# Patient Record
Sex: Female | Born: 1950
Health system: Southern US, Community
[De-identification: ages and names within clinical notes are randomized; demographics above are authoritative.]

## PROBLEM LIST (undated history)

## (undated) DIAGNOSIS — N390 Urinary tract infection, site not specified: Secondary | ICD-10-CM

## (undated) DIAGNOSIS — M549 Dorsalgia, unspecified: Secondary | ICD-10-CM

## (undated) HISTORY — PX: APPENDECTOMY: SHX54

## (undated) HISTORY — PX: CHOLECYSTECTOMY: SHX55

## (undated) HISTORY — PX: CARPAL TUNNEL RELEASE: SHX101

## (undated) HISTORY — PX: ABDOMINAL HYSTERECTOMY: SHX81

---

## 2005-12-08 ENCOUNTER — Emergency Department: Payer: Self-pay | Admitting: Emergency Medicine

## 2007-09-28 ENCOUNTER — Ambulatory Visit: Payer: Self-pay | Admitting: Obstetrics and Gynecology

## 2008-01-21 ENCOUNTER — Ambulatory Visit: Payer: Self-pay | Admitting: Family Medicine

## 2013-01-16 ENCOUNTER — Ambulatory Visit: Payer: Self-pay | Admitting: Family Medicine

## 2013-01-16 ENCOUNTER — Emergency Department: Payer: Self-pay | Admitting: Emergency Medicine

## 2013-01-16 LAB — CBC WITH DIFFERENTIAL/PLATELET
Basophil #: 0.1 10*3/uL (ref 0.0–0.1)
Basophil %: 0.4 %
Eosinophil #: 0 10*3/uL (ref 0.0–0.7)
Eosinophil %: 0.1 %
HCT: 41.4 % (ref 35.0–47.0)
HGB: 14.1 g/dL (ref 12.0–16.0)
Lymphocyte #: 2.3 10*3/uL (ref 1.0–3.6)
Lymphocyte %: 14.4 %
MCH: 30.2 pg (ref 26.0–34.0)
MCHC: 34.1 g/dL (ref 32.0–36.0)
MCV: 89 fL (ref 80–100)
Monocyte #: 0.9 x10 3/mm (ref 0.2–0.9)
Monocyte %: 5.8 %
Neutrophil #: 12.6 10*3/uL — ABNORMAL HIGH (ref 1.4–6.5)
Neutrophil %: 79.3 %
Platelet: 298 10*3/uL (ref 150–440)
RBC: 4.68 10*6/uL (ref 3.80–5.20)
RDW: 12.4 % (ref 11.5–14.5)
WBC: 15.9 10*3/uL — ABNORMAL HIGH (ref 3.6–11.0)

## 2013-01-16 LAB — COMPREHENSIVE METABOLIC PANEL
Albumin: 4.3 g/dL (ref 3.4–5.0)
Alkaline Phosphatase: 109 U/L (ref 50–136)
Anion Gap: 12 (ref 7–16)
BUN: 11 mg/dL (ref 7–18)
Bilirubin,Total: 0.9 mg/dL (ref 0.2–1.0)
Calcium, Total: 8.8 mg/dL (ref 8.5–10.1)
Chloride: 103 mmol/L (ref 98–107)
Co2: 25 mmol/L (ref 21–32)
Creatinine: 0.85 mg/dL (ref 0.60–1.30)
EGFR (African American): >60
EGFR (Non-African Amer.): >60
Glucose: 108 mg/dL — ABNORMAL HIGH (ref 65–99)
Osmolality: 279 (ref 275–301)
Potassium: 3.7 mmol/L (ref 3.5–5.1)
SGOT(AST): 16 U/L (ref 15–37)
SGPT (ALT): 26 U/L (ref 12–78)
Sodium: 140 mmol/L (ref 136–145)
Total Protein: 8.2 g/dL (ref 6.4–8.2)

## 2013-01-16 LAB — URINALYSIS, COMPLETE
Bilirubin,UR: NEGATIVE
Glucose,UR: NEGATIVE mg/dL (ref 0–75)
Ketone: NEGATIVE
Leukocyte Esterase: NEGATIVE
Nitrite: POSITIVE
Ph: 6.5 (ref 4.5–8.0)
Protein: NEGATIVE
Specific Gravity: 1.015 (ref 1.003–1.030)
Squamous Epithelial: NONE SEEN

## 2013-01-16 LAB — WET PREP, GENITAL

## 2013-01-16 LAB — OCCULT BLOOD X 1 CARD TO LAB, STOOL: Occult Blood, Feces: NEGATIVE

## 2013-01-17 LAB — GC/CHLAMYDIA PROBE AMP

## 2013-01-19 LAB — URINE CULTURE

## 2013-03-16 ENCOUNTER — Ambulatory Visit: Payer: Self-pay | Admitting: Family Medicine

## 2013-03-16 LAB — URINALYSIS, COMPLETE
Glucose,UR: NEGATIVE mg/dL (ref 0–75)
Ketone: NEGATIVE
Protein: NEGATIVE
Specific Gravity: 1.02 (ref 1.003–1.030)
Squamous Epithelial: NONE SEEN

## 2013-03-30 ENCOUNTER — Ambulatory Visit: Payer: Self-pay | Admitting: Internal Medicine

## 2013-05-11 ENCOUNTER — Ambulatory Visit: Payer: Self-pay | Admitting: Internal Medicine

## 2014-07-29 ENCOUNTER — Ambulatory Visit: Payer: Self-pay

## 2015-09-14 IMAGING — MG MM ADDITIONAL VIEWS AT NO CHARGE
1 series · 2 of 2 positions shown · non-contrast
Comparison: 03/30/2013

CLINICAL DATA: Possible mass left breast identified on the CC view
of the recent screening mammogram.

EXAM:
DIGITAL DIAGNOSTIC  LEFT MAMMOGRAM

[L ML · left · 2 of 2 slices shown]
[im 1/2]
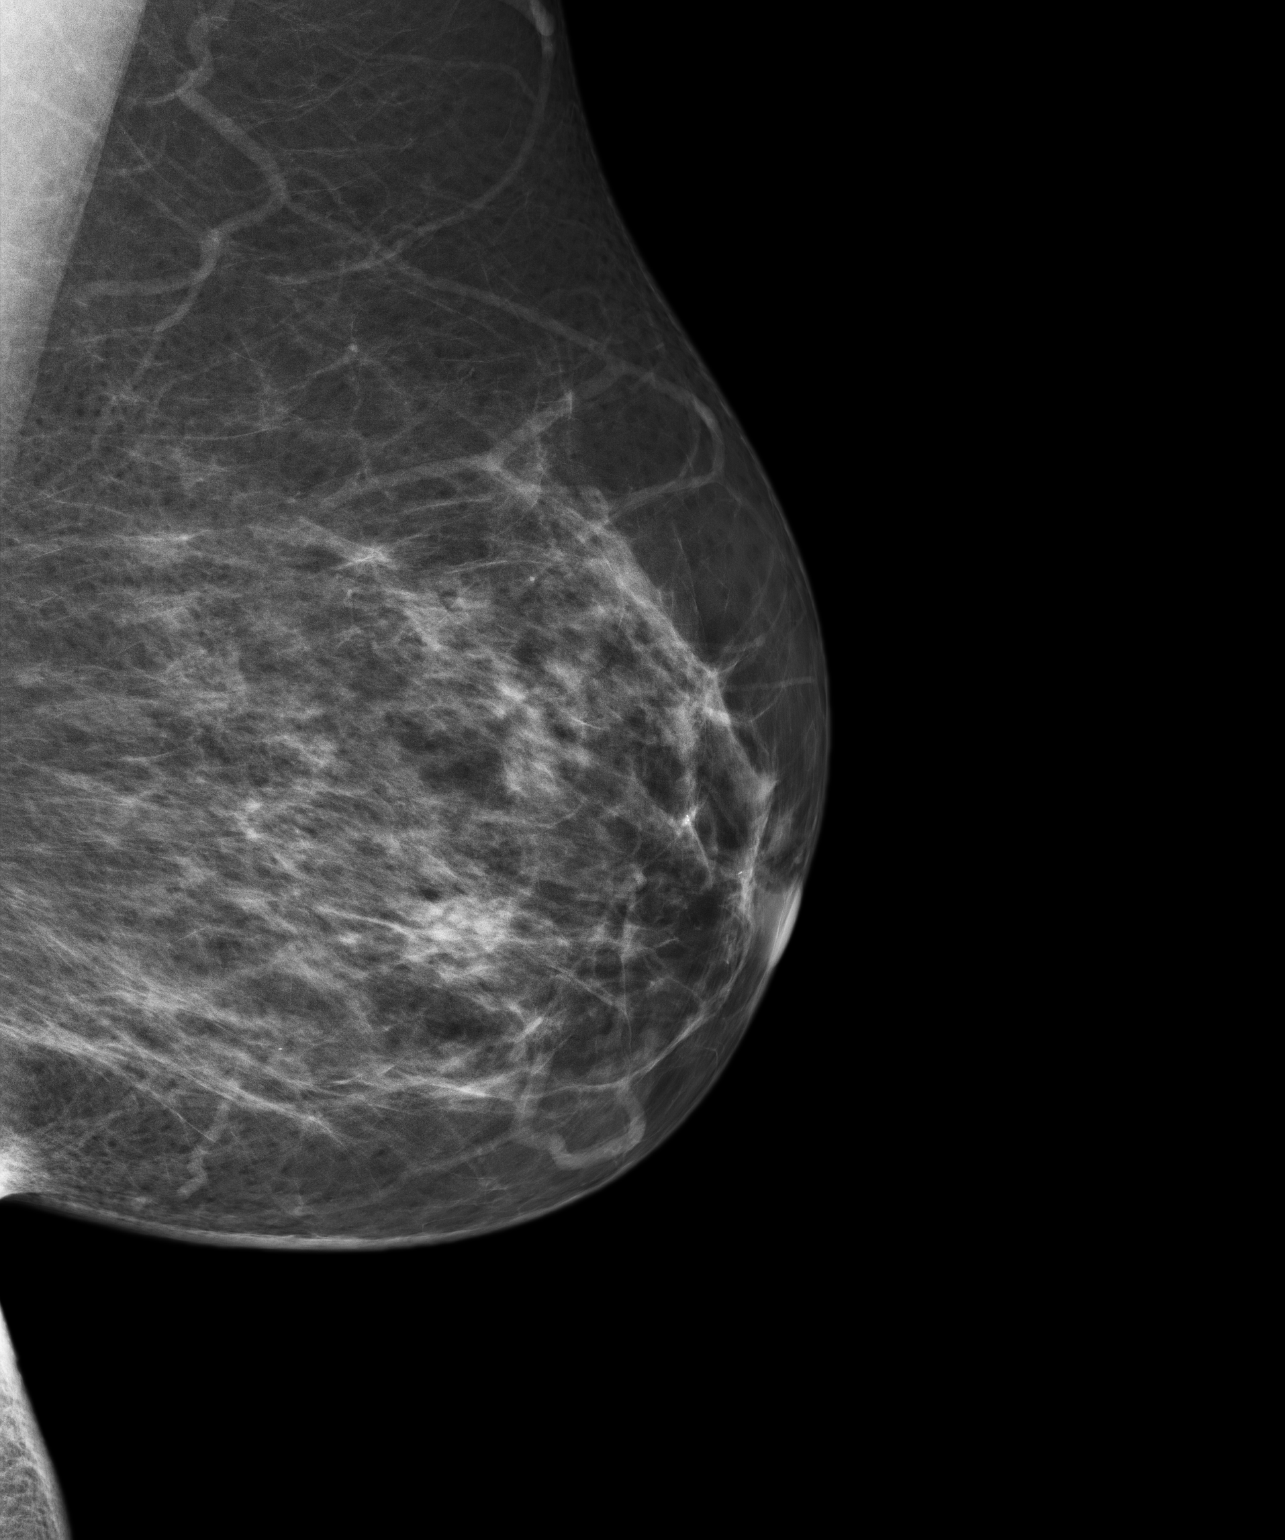
[im 2/2]
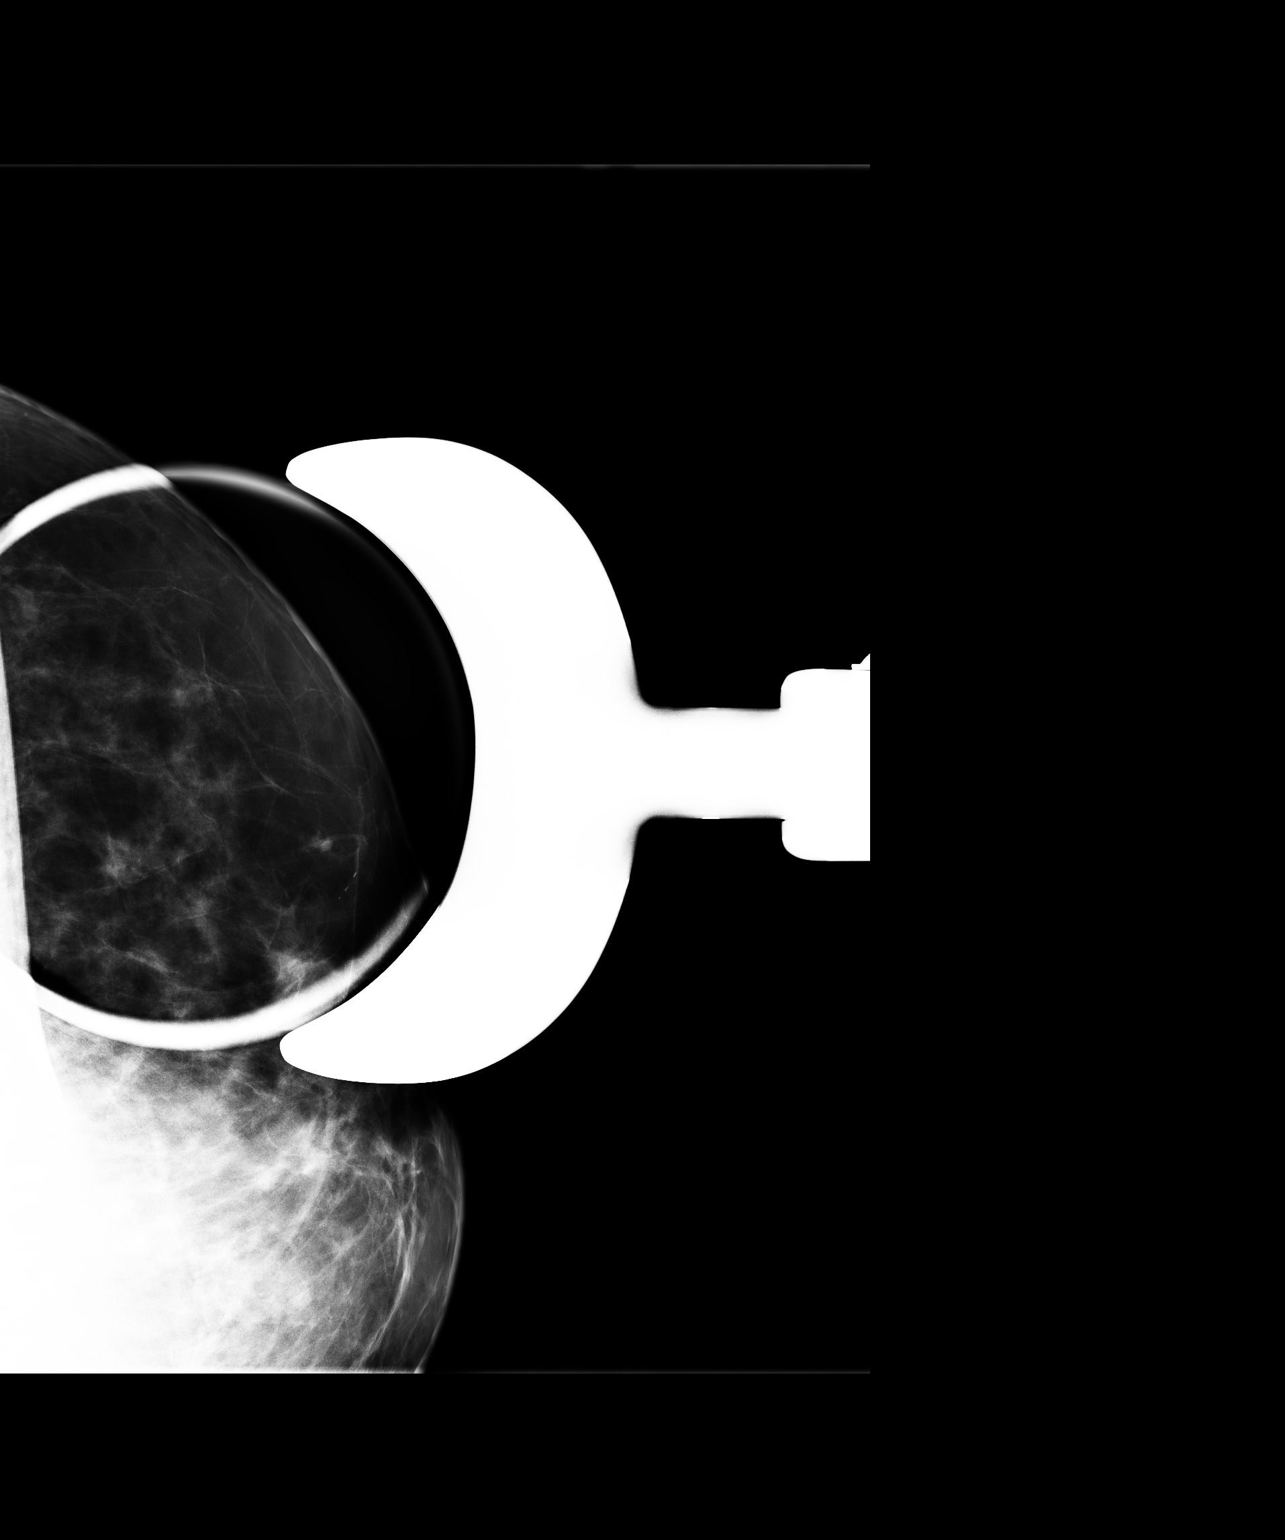

[2 of 2 positions shown; findings below may reference images not displayed]

ACR Breast Density Category b: There are scattered areas of
fibroglandular density.
FINDINGS: Focal spot compression view of the outer left breast in the CC
projection shows dispersion of fibroglandular breast tissue. No
evidence of mass. 90 degree lateral view of the left breast is
negative.
IMPRESSION: No evidence of malignancy in the left breast.

RECOMMENDATION:
Bilateral screening mammogram in 1 year.

I have discussed the findings and recommendations with the patient.
Results were also provided in writing at the conclusion of the
visit. If applicable, a reminder letter will be sent to the patient
regarding the next appointment.

BI-RADS CATEGORY  1: Negative

## 2015-09-22 ENCOUNTER — Ambulatory Visit
Admission: EM | Admit: 2015-09-22 | Discharge: 2015-09-22 | Disposition: A | Discharge status: Home / Self Care | Payer: BLUE CROSS/BLUE SHIELD | Attending: Family Medicine | Admitting: Family Medicine

## 2015-09-22 DIAGNOSIS — H6502 Acute serous otitis media, left ear: Secondary | ICD-10-CM

## 2015-09-22 LAB — RAPID INFLUENZA A&B ANTIGENS (ARMC ONLY)
INFLUENZA A (ARMC): NEGATIVE
INFLUENZA B (ARMC): NEGATIVE

## 2015-09-22 MED ORDER — AMOXICILLIN 875 MG PO TABS: 875.0000 mg | ORAL_TABLET | Freq: Two times a day (BID) | ORAL | Status: DC

## 2015-09-22 NOTE — ED Notes (Signed)
Sudden onset of bodyaches, runny nose, sneezing and watering eyes. Denies fever. + tight cough

## 2015-09-22 NOTE — ED Provider Notes (Signed)
CSN: [id number]     Arrival date & time 09/22/15  1316 History   First MD Initiated Contact with Patient 09/22/15 1457     Chief Complaint  Patient presents with  . Influenza   (Consider location/radiation/quality/duration/timing/severity/associated sxs/prior Treatment) Patient is a 65 y.o. female presenting with URI. The history is provided by the patient.  URI Presenting symptoms: congestion, cough, ear pain and rhinorrhea   Severity:  Moderate Onset quality:  Sudden Duration:  3 days Timing:  Constant Progression:  Worsening Chronicity:  New Relieved by:  Nothing Ineffective treatments:  OTC medications Associated symptoms: myalgias and sneezing   Associated symptoms: no sinus pain and no wheezing   Risk factors: sick contacts   Risk factors: not elderly, no chronic cardiac disease, no chronic kidney disease, no chronic respiratory disease, no diabetes mellitus, no immunosuppression, no recent illness and no recent travel     History reviewed. No pertinent past medical history. Past Surgical History  Procedure Laterality Date  . Cholecystectomy    . Appendectomy    . Abdominal hysterectomy     Family History  Problem Relation Age of Onset  . Cancer Mother    Social History  Substance Use Topics  . Smoking status: Never Smoker   . Smokeless tobacco: None  . Alcohol Use: No   OB History    No data available     Review of Systems  HENT: Positive for congestion, ear pain, rhinorrhea and sneezing.   Respiratory: Positive for cough. Negative for wheezing.   Musculoskeletal: Positive for myalgias.    Allergies  Review of patient's allergies indicates no known allergies.  Home Medications   Prior to Admission medications   Medication Sig Start Date End Date Taking? Authorizing Provider  cetirizine (ZYRTEC) 10 MG tablet Take 10 mg by mouth daily.   Yes Historical Provider, MD  amoxicillin (AMOXIL) 875 MG tablet Take 1 tablet (875 mg total) by mouth 2 (two) times  daily. 09/22/15   Norval Gable, MD   Meds Ordered and Administered this Visit  Medications - No data to display  BP 123/65 mmHg  Pulse 82  Temp(Src) 98.2 F (36.8 C) (Tympanic)  Resp 18  Ht 5\' 3"  (1.6 m)  Wt 230 lb (104.327 kg)  BMI 40.75 kg/m2  SpO2 98% No data found.   Physical Exam  Constitutional: She appears well-developed and well-nourished. No distress.  HENT:  Head: Normocephalic and atraumatic.  Right Ear: Tympanic membrane, external ear and ear canal normal.  Left Ear: External ear and ear canal normal. Tympanic membrane is erythematous and bulging. A middle ear effusion is present.  Nose: Mucosal edema and rhinorrhea present. No nose lacerations, sinus tenderness, nasal deformity, septal deviation or nasal septal hematoma. No epistaxis.  No foreign bodies. Right sinus exhibits maxillary sinus tenderness and frontal sinus tenderness. Left sinus exhibits maxillary sinus tenderness and frontal sinus tenderness.  Mouth/Throat: Uvula is midline, oropharynx is clear and moist and mucous membranes are normal. No oropharyngeal exudate.  Eyes: Conjunctivae and EOM are normal. Pupils are equal, round, and reactive to light. Right eye exhibits no discharge. Left eye exhibits no discharge. No scleral icterus.  Neck: Normal range of motion. Neck supple. No thyromegaly present.  Cardiovascular: Normal rate, regular rhythm and normal heart sounds.   Pulmonary/Chest: Effort normal and breath sounds normal. No respiratory distress. She has no wheezes. She has no rales.  Lymphadenopathy:    She has no cervical adenopathy.  Skin: She is not diaphoretic.  Nursing  note and vitals reviewed.   ED Course  Procedures (including critical care time)  Labs Review Labs Reviewed  RAPID INFLUENZA A&B ANTIGENS ([place])    Imaging Review No results found.   Visual Acuity Review  Right Eye Distance:   Left Eye Distance:   Bilateral Distance:    Right Eye Near:   Left Eye Near:     Bilateral Near:         MDM   1. Acute serous otitis media of left ear, recurrence not specified    Discharge Medication List as of 09/22/2015  3:25 PM    START taking these medications   Details  amoxicillin (AMOXIL) 875 MG tablet Take 1 tablet (875 mg total) by mouth 2 (two) times daily., Starting 09/22/2015, Until Discontinued, Print       1. Lab results and diagnosis reviewed with patient 2. rx as per orders above; reviewed possible side effects, interactions, risks and benefits  3. Recommend supportive treatment with otc analgesics prn 4. Follow-up prn if symptoms worsen or don't improve   Norval Gable, MD 09/22/15 1646

## 2015-10-03 ENCOUNTER — Encounter: Payer: Self-pay | Admitting: Internal Medicine

## 2015-10-03 DIAGNOSIS — K5730 Diverticulosis of large intestine without perforation or abscess without bleeding: Secondary | ICD-10-CM | POA: Insufficient documentation

## 2015-10-03 DIAGNOSIS — K760 Fatty (change of) liver, not elsewhere classified: Secondary | ICD-10-CM | POA: Insufficient documentation

## 2015-10-03 DIAGNOSIS — E785 Hyperlipidemia, unspecified: Secondary | ICD-10-CM | POA: Insufficient documentation

## 2015-10-06 ENCOUNTER — Ambulatory Visit (INDEPENDENT_AMBULATORY_CARE_PROVIDER_SITE_OTHER): Payer: BLUE CROSS/BLUE SHIELD | Admitting: Internal Medicine

## 2015-10-06 ENCOUNTER — Encounter: Payer: Self-pay | Admitting: Internal Medicine

## 2015-10-06 VITALS — BP 130/82 | HR 76 | Ht 63.0 in | Wt 242.0 lb

## 2015-10-06 DIAGNOSIS — M65311 Trigger thumb, right thumb: Secondary | ICD-10-CM | POA: Diagnosis not present

## 2015-10-06 DIAGNOSIS — Z1211 Encounter for screening for malignant neoplasm of colon: Secondary | ICD-10-CM | POA: Diagnosis not present

## 2015-10-06 DIAGNOSIS — G8929 Other chronic pain: Secondary | ICD-10-CM

## 2015-10-06 DIAGNOSIS — M62830 Muscle spasm of back: Secondary | ICD-10-CM | POA: Diagnosis not present

## 2015-10-06 DIAGNOSIS — R110 Nausea: Secondary | ICD-10-CM | POA: Diagnosis not present

## 2015-10-06 DIAGNOSIS — M533 Sacrococcygeal disorders, not elsewhere classified: Secondary | ICD-10-CM

## 2015-10-06 MED ORDER — ETODOLAC 300 MG PO CAPS: 300.0000 mg | ORAL_CAPSULE | Freq: Three times a day (TID) | ORAL | Status: DC

## 2015-10-06 MED ORDER — METHOCARBAMOL 500 MG PO TABS: 500.0000 mg | ORAL_TABLET | Freq: Three times a day (TID) | ORAL | Status: DC

## 2015-10-06 NOTE — Progress Notes (Signed)
Date:  10/06/2015   Name:  Stephanie Ferguson   DOB:  1951-06-24   MRN:  [id number]   Chief Complaint: Nausea; Back Pain; and Thumb Pain Back Pain This is a chronic problem. The current episode started more than 1 year ago. The problem occurs daily. The problem is unchanged. The pain is present in the lumbar spine. The symptoms are aggravated by standing and twisting. Pertinent negatives include no abdominal pain, chest pain, dysuria, fever, headaches, numbness or weakness. She has tried heat (but advil does not help) for the symptoms.   Nausea - constant nausea - no vomiting. No change in bowels.  She has waves of nausea that come and go.  She has not tried any otc medication.  She does have early satiety but has gained weight. Nausea has been ongoing issue for many years. The only workup with a CT of the abdomen which showed diverticulitis. She's never had a colonoscopy or an EGD.   Thumb pain - unable to bend right thumb.  No injury.  Started with a clicking and now can not bend at all for the past few weeks. 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 She is right-handed.  Review of Systems  Constitutional: Negative for fever, chills, fatigue and unexpected weight change.  HENT: Negative for sore throat, tinnitus and trouble swallowing.   Respiratory: Negative for cough, chest tightness and shortness of breath.   Cardiovascular: Negative for chest pain, palpitations and leg swelling.  Gastrointestinal: Positive for nausea. Negative for vomiting, abdominal pain, diarrhea, constipation and blood in stool.  Genitourinary: Negative for dysuria.  Musculoskeletal: Positive for back pain and arthralgias. Negative for myalgias and gait problem.  Neurological: Negative for tremors, syncope, weakness, numbness and headaches.  Psychiatric/Behavioral: Negative for sleep disturbance.    Patient Active Problem List   Diagnosis Date Noted  .  Sigmoid diverticulosis 10/03/2015  . Fatty liver 10/03/2015  . Hyperlipidemia, mild 10/03/2015    Prior to Admission medications   Medication Sig Start Date End Date Taking? Authorizing Provider  cetirizine (ZYRTEC) 10 MG tablet Take 10 mg by mouth daily.   Yes Historical Provider, MD    No Known Allergies  Past Surgical History  Procedure Laterality Date  . Cholecystectomy    . Appendectomy    . Abdominal hysterectomy      ovaries remain    Social History  Substance Use Topics  . Smoking status: Never Smoker   . Smokeless tobacco: None  . Alcohol Use: No    Medication list has been reviewed and updated.   Physical Exam  Constitutional: She is oriented to person, place, and time. She appears well-developed. No distress.  HENT:  Head: Normocephalic and atraumatic.  Neck: Normal range of motion. Neck supple. Carotid bruit is not present. No thyromegaly present.  Cardiovascular: Normal rate, regular rhythm and normal heart sounds.   Pulmonary/Chest: Effort normal and breath sounds normal. No respiratory distress. She has no wheezes. She has no rales.  Abdominal: Soft. Normal appearance and bowel sounds are normal. There is no tenderness. There is no CVA tenderness.  Musculoskeletal: Normal range of motion.       Right hip: She exhibits tenderness (with internal and external rotation).       Lumbar back: She exhibits tenderness and spasm. She exhibits normal range of motion.  SLR negative bilaterally  Right thumb tender at base of MCP, good movement at MCP  and PIP but unable to bend DIP independently.  DIP moves freely with passive motion  Lymphadenopathy:    She has no cervical adenopathy.  Neurological: She is alert and oriented to person, place, and time.  Skin: Skin is warm and dry. No rash noted.  Psychiatric: She has a normal mood and affect. Her speech is normal and behavior is normal. Thought content normal.  Nursing note and vitals reviewed.   BP 130/82 mmHg   Pulse 76  Ht 5\' 3"  (1.6 m)  Wt 242 lb (109.77 kg)  BMI 42.88 kg/m2  Assessment and Plan: 1. Trigger thumb of right hand - Ambulatory referral to Orthopedic Surgery  2. Muscle spasm of back Continue to use heat; add robaxin - TSH - methocarbamol (ROBAXIN) 500 MG tablet; Take 1 tablet (500 mg total) by mouth 3 (three) times daily.  Dispense: 90 tablet; Refill: 0  3. Chronic SI joint pain Continue heat; add robaxin and lodine - etodolac (LODINE) 300 MG capsule; Take 1 capsule (300 mg total) by mouth every 8 (eight) hours.  Dispense: 90 capsule; Refill: 3  4. Nausea Uncertain cause - rule out H Pylori and refer to GI for possible EGD - Comprehensive metabolic panel - CBC with Differential/Platelet - H. pylori antibody, IgG - Ambulatory referral to Gastroenterology  5. Colon cancer screening Referred to GI   Halina Maidens, MD [facility]  10/06/2015

## 2015-10-06 NOTE — Patient Instructions (Signed)
Sacroiliac (SI) Joint Injection Patient Information  Description: The sacroiliac joint connects the scrum (very low back and tailbone) to the ilium (a pelvic bone which also forms half of the hip joint).  Normally this joint experiences very little motion.  When this joint becomes inflamed or unstable low back and or hip and pelvis pain may result.  Injection of this joint with local anesthetics (numbing medicines) and steroids can provide diagnostic information and reduce pain.  This injection is performed with the aid of x-ray guidance into the tailbone area while you are lying on your stomach.   You may experience an electrical sensation down the leg while this is being done.  You may also experience numbness.  We also may ask if we are reproducing your normal pain during the injection.  Conditions which may be treated SI injection:   Low back, buttock, hip or leg pain  Preparation for the Injection:  1. Do not eat any solid food or dairy products within 8 hours of your appointment.  2. You may drink clear liquids up to 3 hours before appointment.  Clear liquids include water, black coffee, juice or soda.  No milk or cream please. 3. You may take your regular medications, including pain medications with a sip of water before your appointment.  Diabetics should hold regular insulin (if take separately) and take 1/2 normal NPH dose the morning of the procedure.  Carry some sugar containing items with you to your appointment. 4. A driver must accompany you and be prepared to drive you home after your procedure. 5. Bring all of your current medications with you. 6. An IV may be inserted and sedation may be given at the discretion of the physician. 7. A blood pressure cuff, EKG and other monitors will often be applied during the procedure.  Some patients may need to have extra oxygen administered for a short period.  8. You will be asked to provide medical information, including your allergies,  prior to the procedure.  We must know immediately if you are taking blood thinners (like Coumadin/Warfarin) or if you are allergic to IV iodine contrast (dye).  We must know if you could possible be pregnant.  Possible side effects:   Bleeding from needle site  Infection (rare, may require surgery)  Nerve injury (rare)  Numbness & tingling (temporary)  A brief convulsion or seizure  Light-headedness (temporary)  Pain at injection site (several days)  Decreased blood pressure (temporary)  Weakness in the leg (temporary)   Call if you experience:   New onset weakness or numbness of an extremity below the injection site that last more than 8 hours.  Hives or difficulty breathing ( go to the emergency room)  Inflammation or drainage at the injection site  Any new symptoms which are concerning to you  Please note:  Although the local anesthetic injected can often make your back/ hip/ buttock/ leg feel good for several hours after the injections, the pain will likely return.  It takes 3-7 days for steroids to work in the sacroiliac area.  You may not notice any pain relief for at least that one week.  If effective, we will often do a series of three injections spaced 3-6 weeks apart to maximally decrease your pain.  After the initial series, we generally will wait some months before a repeat injection of the same type.  If you have any questions, please call [phone] [facility]   

## 2015-10-07 ENCOUNTER — Other Ambulatory Visit: Payer: Self-pay | Admitting: Internal Medicine

## 2015-10-07 LAB — COMPREHENSIVE METABOLIC PANEL
ALT: 24 IU/L (ref 0–32)
AST: 23 IU/L (ref 0–40)
Albumin/Globulin Ratio: 1.7 (ref 1.2–2.2)
Albumin: 4.4 g/dL (ref 3.6–4.8)
Alkaline Phosphatase: 98 IU/L (ref 39–117)
BUN/Creatinine Ratio: 15 (ref 11–26)
BUN: 11 mg/dL (ref 8–27)
Bilirubin Total: 0.6 mg/dL (ref 0.0–1.2)
CALCIUM: 8.7 mg/dL (ref 8.7–10.3)
CO2: 23 mmol/L (ref 18–29)
CREATININE: 0.71 mg/dL (ref 0.57–1.00)
Chloride: 99 mmol/L (ref 96–106)
GFR calc Af Amer: 106 mL/min/{1.73_m2} (ref >59)
GFR, EST NON AFRICAN AMERICAN: 92 mL/min/{1.73_m2} (ref >59)
GLUCOSE: 94 mg/dL (ref 65–99)
Globulin, Total: 2.6 g/dL (ref 1.5–4.5)
POTASSIUM: 4.5 mmol/L (ref 3.5–5.2)
Sodium: 140 mmol/L (ref 134–144)
Total Protein: 7 g/dL (ref 6.0–8.5)

## 2015-10-07 LAB — CBC WITH DIFFERENTIAL/PLATELET
BASOS: 1 %
Basophils Absolute: 0.1 10*3/uL (ref 0.0–0.2)
EOS (ABSOLUTE): 0.1 10*3/uL (ref 0.0–0.4)
EOS: 2 %
Hematocrit: 42.5 % (ref 34.0–46.6)
Hemoglobin: 14.1 g/dL (ref 11.1–15.9)
IMMATURE GRANS (ABS): 0 10*3/uL (ref 0.0–0.1)
IMMATURE GRANULOCYTES: 0 %
LYMPHS: 39 %
Lymphocytes Absolute: 3.7 10*3/uL — ABNORMAL HIGH (ref 0.7–3.1)
MCH: 29.6 pg (ref 26.6–33.0)
MCHC: 33.2 g/dL (ref 31.5–35.7)
MCV: 89 fL (ref 79–97)
Monocytes Absolute: 0.7 10*3/uL (ref 0.1–0.9)
Monocytes: 7 %
NEUTROS PCT: 51 %
Neutrophils Absolute: 4.8 10*3/uL (ref 1.4–7.0)
PLATELETS: 323 10*3/uL (ref 150–379)
RBC: 4.77 x10E6/uL (ref 3.77–5.28)
RDW: 13.5 % (ref 12.3–15.4)
WBC: 9.3 10*3/uL (ref 3.4–10.8)

## 2015-10-07 LAB — TSH: TSH: 1.44 u[IU]/mL (ref 0.450–4.500)

## 2015-10-07 LAB — H. PYLORI ANTIBODY, IGG: H PYLORI IGG: 7.8 U/mL — AB (ref 0.0–0.8)

## 2015-10-07 MED ORDER — METRONIDAZOLE 500 MG PO TABS: 500.0000 mg | ORAL_TABLET | Freq: Three times a day (TID) | ORAL | Status: DC

## 2015-10-07 MED ORDER — OMEPRAZOLE 20 MG PO CPDR: 20.0000 mg | DELAYED_RELEASE_CAPSULE | Freq: Every day | ORAL | Status: DC

## 2015-10-07 MED ORDER — CIPROFLOXACIN HCL 500 MG PO TABS: 500.0000 mg | ORAL_TABLET | Freq: Two times a day (BID) | ORAL | Status: DC

## 2015-10-08 ENCOUNTER — Telehealth: Payer: Self-pay

## 2015-10-08 NOTE — Telephone Encounter (Signed)
-----   Message from Glean Hess, MD sent at 10/07/2015  5:09 PM EDT ----- Labs are normal except for H Pylori [likely causing nausea].  Need to treat with double antibiotics plus omeprazole, all for 7 days.  I will send to pharmacy.

## 2015-10-08 NOTE — Telephone Encounter (Signed)
Spoke with patient. Patient advised of all results and verbalized understanding. Will call back with any future questions or concerns. MA[facility]  

## 2015-10-17 ENCOUNTER — Telehealth: Payer: Self-pay

## 2015-10-17 ENCOUNTER — Other Ambulatory Visit: Payer: Self-pay

## 2015-10-17 NOTE — Telephone Encounter (Signed)
Gastroenterology Pre-Procedure Review  Request Date: 11/06/15 Requesting Physician: Dr. Army Melia  PATIENT REVIEW QUESTIONS: The patient responded to the following health history questions as indicated:    1. Are you having any GI issues? no 2. Do you have a personal history of Polyps? no 3. Do you have a family history of Colon Cancer or Polyps? no 4. Diabetes Mellitus? no 5. Joint replacements in the past 12 months?no 6. Major health problems in the past 3 months?no 7. Any artificial heart valves, MVP, or defibrillator?no    MEDICATIONS & ALLERGIES:    Patient reports the following regarding taking any anticoagulation/antiplatelet therapy:   Plavix, Coumadin, Eliquis, Xarelto, Lovenox, Pradaxa, Brilinta, or Effient? no Aspirin? no  Patient confirms/reports the following medications:  Current Outpatient Prescriptions  Medication Sig Dispense Refill  . cetirizine (ZYRTEC) 10 MG tablet Take 10 mg by mouth daily.    . ciprofloxacin (CIPRO) 500 MG tablet Take 1 tablet (500 mg total) by mouth 2 (two) times daily. 14 tablet 0  . etodolac (LODINE) 300 MG capsule Take 1 capsule (300 mg total) by mouth every 8 (eight) hours. 90 capsule 3  . methocarbamol (ROBAXIN) 500 MG tablet Take 1 tablet (500 mg total) by mouth 3 (three) times daily. 90 tablet 0  . metroNIDAZOLE (FLAGYL) 500 MG tablet Take 1 tablet (500 mg total) by mouth 3 (three) times daily. 21 tablet 0  . omeprazole (PRILOSEC) 20 MG capsule Take 1 capsule (20 mg total) by mouth daily. 60 capsule 5   No current facility-administered medications for this visit.    Patient confirms/reports the following allergies:  No Known Allergies  No orders of the defined types were placed in this encounter.    AUTHORIZATION INFORMATION Primary Insurance: 1D#: Group #:  Secondary Insurance: 1D#: Group #:  SCHEDULE INFORMATION: Date: 11/06/15 Time: Location: [place]

## 2015-11-04 ENCOUNTER — Other Ambulatory Visit: Payer: Self-pay

## 2015-11-04 MED ORDER — PEG 3350-KCL-NABCB-NACL-NASULF 236 G PO SOLR: 4000.0000 mL | Freq: Once | ORAL | Status: DC

## 2015-11-05 NOTE — Discharge Instructions (Signed)

## 2015-11-06 ENCOUNTER — Ambulatory Visit: Payer: BLUE CROSS/BLUE SHIELD | Admitting: Anesthesiology

## 2015-11-06 ENCOUNTER — Ambulatory Visit
Admission: RE | Admit: 2015-11-06 | Discharge: 2015-11-06 | Disposition: A | Discharge status: Home / Self Care | Payer: BLUE CROSS/BLUE SHIELD | Source: Ambulatory Visit | Attending: Gastroenterology | Admitting: Gastroenterology

## 2015-11-06 ENCOUNTER — Encounter
Admission: RE | Disposition: A | Discharge status: Home / Self Care | Payer: Self-pay | Source: Ambulatory Visit | Attending: Gastroenterology

## 2015-11-06 ENCOUNTER — Ambulatory Visit: Admit: 2015-11-06 | Payer: Self-pay | Admitting: Gastroenterology

## 2015-11-06 DIAGNOSIS — D126 Benign neoplasm of colon, unspecified: Secondary | ICD-10-CM | POA: Insufficient documentation

## 2015-11-06 DIAGNOSIS — K2970 Gastritis, unspecified, without bleeding: Secondary | ICD-10-CM | POA: Diagnosis not present

## 2015-11-06 DIAGNOSIS — R110 Nausea: Secondary | ICD-10-CM | POA: Insufficient documentation

## 2015-11-06 DIAGNOSIS — B9681 Helicobacter pylori [H. pylori] as the cause of diseases classified elsewhere: Secondary | ICD-10-CM | POA: Insufficient documentation

## 2015-11-06 DIAGNOSIS — K2950 Unspecified chronic gastritis without bleeding: Secondary | ICD-10-CM | POA: Insufficient documentation

## 2015-11-06 DIAGNOSIS — K641 Second degree hemorrhoids: Secondary | ICD-10-CM | POA: Insufficient documentation

## 2015-11-06 DIAGNOSIS — K921 Melena: Secondary | ICD-10-CM | POA: Diagnosis not present

## 2015-11-06 DIAGNOSIS — K5730 Diverticulosis of large intestine without perforation or abscess without bleeding: Secondary | ICD-10-CM | POA: Insufficient documentation

## 2015-11-06 DIAGNOSIS — Z79899 Other long term (current) drug therapy: Secondary | ICD-10-CM | POA: Diagnosis not present

## 2015-11-06 DIAGNOSIS — R6881 Early satiety: Secondary | ICD-10-CM | POA: Diagnosis not present

## 2015-11-06 DIAGNOSIS — D124 Benign neoplasm of descending colon: Secondary | ICD-10-CM | POA: Diagnosis not present

## 2015-11-06 HISTORY — PX: ESOPHAGOGASTRODUODENOSCOPY (EGD) WITH PROPOFOL: SHX5813

## 2015-11-06 HISTORY — DX: Dorsalgia, unspecified: M54.9

## 2015-11-06 HISTORY — PX: POLYPECTOMY: SHX5525

## 2015-11-06 HISTORY — DX: Urinary tract infection, site not specified: N39.0

## 2015-11-06 HISTORY — PX: COLONOSCOPY WITH PROPOFOL: SHX5780

## 2015-11-06 SURGERY — COLONOSCOPY WITH PROPOFOL
Anesthesia: Monitor Anesthesia Care

## 2015-11-06 SURGERY — COLONOSCOPY WITH PROPOFOL
Anesthesia: Choice

## 2015-11-06 MED ORDER — SIMETHICONE 40 MG/0.6ML PO SUSP
ORAL | Status: DC | PRN
  Administered 2015-11-06: 09:00:00

## 2015-11-06 MED ORDER — PROPOFOL 10 MG/ML IV BOLUS
INTRAVENOUS | Status: DC | PRN
  Administered 2015-11-06 (×6): 20 mg via INTRAVENOUS

## 2015-11-06 MED ORDER — SODIUM CHLORIDE 0.9 % IV SOLN: INTRAVENOUS | Status: DC

## 2015-11-06 MED ORDER — LACTATED RINGERS IV SOLN
INTRAVENOUS | Status: DC
  Administered 2015-11-06: 08:00:00 via INTRAVENOUS

## 2015-11-06 MED ORDER — LACTATED RINGERS IV SOLN: 500.0000 mL | INTRAVENOUS | Status: DC

## 2015-11-06 SURGICAL SUPPLY — 33 items
BALLN DILATOR 10-12 8 (BALLOONS)
BALLN DILATOR 12-15 8 (BALLOONS)
BALLN DILATOR 15-18 8 (BALLOONS)
BALLN DILATOR CRE 0-12 8 (BALLOONS)
BALLN DILATOR ESOPH 8 10 CRE (MISCELLANEOUS) IMPLANT
BALLOON DILATOR 12-15 8 (BALLOONS) IMPLANT
BALLOON DILATOR 15-18 8 (BALLOONS) IMPLANT
BALLOON DILATOR CRE 0-12 8 (BALLOONS) IMPLANT
BLOCK BITE 60FR ADLT L/F GRN (MISCELLANEOUS) ×4 IMPLANT
CANISTER SUCT 1200ML W/VALVE (MISCELLANEOUS) ×4 IMPLANT
CLIP HMST 235XBRD CATH ROT (MISCELLANEOUS) IMPLANT
CLIP RESOLUTION 360 11X235 (MISCELLANEOUS)
FCP ESCP3.2XJMB 240X2.8X (MISCELLANEOUS)
FORCEPS BIOP RAD 4 LRG CAP 4 (CUTTING FORCEPS) IMPLANT
FORCEPS BIOP RJ4 240 W/NDL (MISCELLANEOUS)
FORCEPS ESCP3.2XJMB 240X2.8X (MISCELLANEOUS) IMPLANT
GOWN CVR UNV OPN BCK APRN NK (MISCELLANEOUS) ×4 IMPLANT
GOWN ISOL THUMB LOOP REG UNIV (MISCELLANEOUS) ×4
INJECTOR VARIJECT VIN23 (MISCELLANEOUS) IMPLANT
KIT DEFENDO VALVE AND CONN (KITS) IMPLANT
KIT ENDO PROCEDURE OLY (KITS) ×4 IMPLANT
MARKER SPOT ENDO TATTOO 5ML (MISCELLANEOUS) IMPLANT
PAD GROUND ADULT SPLIT (MISCELLANEOUS) IMPLANT
PROBE APC STR FIRE (PROBE) IMPLANT
SNARE SHORT THROW 13M SML OVAL (MISCELLANEOUS) IMPLANT
SNARE SHORT THROW 30M LRG OVAL (MISCELLANEOUS) IMPLANT
SNARE SNG USE RND 15MM (INSTRUMENTS) IMPLANT
SPOT EX ENDOSCOPIC TATTOO (MISCELLANEOUS)
SYR INFLATION 60ML (SYRINGE) IMPLANT
TRAP ETRAP POLY (MISCELLANEOUS) IMPLANT
VARIJECT INJECTOR VIN23 (MISCELLANEOUS)
WATER STERILE IRR 250ML POUR (IV SOLUTION) ×4 IMPLANT
WIRE CRE 18-20MM 8CM F G (MISCELLANEOUS) IMPLANT

## 2015-11-06 NOTE — Op Note (Signed)
[facility] Gastroenterology Patient Name: Stephanie Ferguson Procedure Date: 11/06/2015 8:20 AM MRN: [id number] Account #: [id number] Date of Birth: 04/09/1951 Admit Type: Outpatient Age: 65 Room: [facility] OR ROOM 01 Gender: Female Note Status: Finalized Procedure:            Colonoscopy Indications:          Hematochezia Providers:            Lucilla Lame, MD Medicines:            Propofol per Anesthesia Complications:        No immediate complications. Procedure:            Pre-Anesthesia Assessment:                       - Prior to the procedure, a History and Physical was                        performed, and patient medications and allergies were                        reviewed. The patient's tolerance of previous                        anesthesia was also reviewed. The risks and benefits of                        the procedure and the sedation options and risks were                        discussed with the patient. All questions were                        answered, and informed consent was obtained. Prior                        Anticoagulants: The patient has taken no previous                        anticoagulant or antiplatelet agents. ASA Grade                        Assessment: II - A patient with mild systemic disease.                        After reviewing the risks and benefits, the patient was                        deemed in satisfactory condition to undergo the                        procedure.                       After obtaining informed consent, the colonoscope was                        passed under direct vision. Throughout the procedure,                        the patient's blood pressure, pulse, and oxygen  saturations were monitored continuously. The Olympus CF                        H180AL colonoscope (S#: [id number]) was introduced through                        the anus and advanced to the the cecum, identified by                         appendiceal orifice and ileocecal valve. The                        colonoscopy was performed without difficulty. The                        patient tolerated the procedure well. The quality of                        the bowel preparation was excellent. Findings:      The perianal and digital rectal examinations were normal.      A 4 mm polyp was found in the descending colon. The polyp was sessile.       The polyp was removed with a cold biopsy forceps. Resection and       retrieval were complete.      Non-bleeding internal hemorrhoids were found during retroflexion. The       hemorrhoids were Grade II (internal hemorrhoids that prolapse but reduce       spontaneously).      Multiple small-mouthed diverticula were found in the sigmoid colon. Impression:           - One 4 mm polyp in the descending colon, removed with                        a cold biopsy forceps. Resected and retrieved.                       - Non-bleeding internal hemorrhoids.                       - Diverticulosis in the sigmoid colon. Recommendation:       - Await pathology results.                       - Repeat colonoscopy in 5 years if polyp adenoma and 10                        years if hyperplastic Procedure Code(s):    --- Professional ---                       [phone], Colonoscopy, flexible; with biopsy, single or                        multiple Diagnosis Code(s):    --- Professional ---                       K92.1, Melena (includes Hematochezia)                       D12.4, Benign neoplasm of descending colon CPT copyright 2016 American  Medical Association. All rights reserved. The codes documented in this report are preliminary and upon coder review may  be revised to meet current compliance requirements. Lucilla Lame, MD 11/06/2015 8:47:10 AM This report has been signed electronically. Number of Addenda: 0 Note Initiated On: 11/06/2015 8:20 AM Scope Withdrawal Time: 0 hours 6 minutes 9 seconds   Total Procedure Duration: 0 hours 8 minutes 1 second       [facility]

## 2015-11-06 NOTE — Transfer of Care (Signed)
Immediate Anesthesia Transfer of Care Note  Patient: Stephanie Ferguson  Procedure(s) Performed: Procedure(s): COLONOSCOPY WITH PROPOFOL (N/A) ESOPHAGOGASTRODUODENOSCOPY (EGD) WITH PROPOFOL (N/A) POLYPECTOMY  Patient Location: PACU  Anesthesia Type: MAC  Level of Consciousness: awake, alert  and patient cooperative  Airway and Oxygen Therapy: Patient Spontanous Breathing and Patient connected to supplemental oxygen  Post-op Assessment: Post-op Vital signs reviewed, Patient's Cardiovascular Status Stable, Respiratory Function Stable, Patent Airway and No signs of Nausea or vomiting  Post-op Vital Signs: Reviewed and stable  Complications: No apparent anesthesia complications

## 2015-11-06 NOTE — Anesthesia Postprocedure Evaluation (Deleted)
Anesthesia Post Note  Patient: Stephanie Ferguson  Procedure(s) Performed: Procedure(s) (LRB): COLONOSCOPY WITH PROPOFOL (N/A) ESOPHAGOGASTRODUODENOSCOPY (EGD) WITH PROPOFOL (N/A) POLYPECTOMY  Patient location during evaluation: PACU Anesthesia Type: MAC Level of consciousness: awake and alert Pain management: pain level controlled Vital Signs Assessment: post-procedure vital signs reviewed and stable Respiratory status: spontaneous breathing, nonlabored ventilation and respiratory function stable Cardiovascular status: blood pressure returned to baseline and stable Postop Assessment: no signs of nausea or vomiting Anesthetic complications: no    Quenton Recendez D Tayten Bergdoll

## 2015-11-06 NOTE — Op Note (Signed)
[facility] Gastroenterology Patient Name: Stephanie Ferguson Procedure Date: 11/06/2015 8:20 AM MRN: [id number] Account #: [id number] Date of Birth: 1951-05-09 Admit Type: Outpatient Age: 65 Room: [facility] [place] OR ROOM 01 Gender: Female Note Status: Finalized Procedure:            Upper GI endoscopy Indications:          Early satiety, Nausea Providers:            Lucilla Lame, MD Referring MD:         Halina Maidens, MD (Referring MD) Medicines:            Propofol per Anesthesia Complications:        No immediate complications. Procedure:            Pre-Anesthesia Assessment:                       - Prior to the procedure, a History and Physical was                        performed, and patient medications and allergies were                        reviewed. The patient's tolerance of previous                        anesthesia was also reviewed. The risks and benefits of                        the procedure and the sedation options and risks were                        discussed with the patient. All questions were                        answered, and informed consent was obtained. Prior                        Anticoagulants: The patient has taken no previous                        anticoagulant or antiplatelet agents. ASA Grade                        Assessment: II - A patient with mild systemic disease.                        After reviewing the risks and benefits, the patient was                        deemed in satisfactory condition to undergo the                        procedure.                       After obtaining informed consent, the endoscope was                        passed under direct vision. Throughout the procedure,  the patient's blood pressure, pulse, and oxygen                        saturations were monitored continuously. The Olympus                        GIF H180J colonscope [id number]) was introduced   through the mouth, and advanced to the second part of                        duodenum. The upper GI endoscopy was accomplished                        without difficulty. The patient tolerated the procedure                        well. Findings:      The examined esophagus was normal.      Localized mild inflammation characterized by erythema was found in the       gastric antrum. Biopsies were taken with a cold forceps for histology.      The examined duodenum was normal. Impression:           - Normal esophagus.                       - Gastritis. Biopsied.                       - Normal examined duodenum. Recommendation:       - Await pathology results.                       - Perform a colonoscopy today. Procedure Code(s):    --- Professional ---                       [phone], Esophagogastroduodenoscopy, flexible, transoral;                        with biopsy, single or multiple Diagnosis Code(s):    --- Professional ---                       R68.81, Early satiety                       R11.0, Nausea                       K29.70, Gastritis, unspecified, without bleeding CPT copyright 2016 American Medical Association. All rights reserved. The codes documented in this report are preliminary and upon coder review may  be revised to meet current compliance requirements. Lucilla Lame, MD 11/06/2015 8:35:15 AM This report has been signed electronically. Number of Addenda: 0 Note Initiated On: 11/06/2015 8:20 AM Total Procedure Duration: 0 hours 2 minutes 25 seconds       [facility]

## 2015-11-06 NOTE — Anesthesia Procedure Notes (Signed)
Procedure Name: MAC Performed by: Aiya Keach Pre-anesthesia Checklist: Patient identified, Emergency Drugs available, Suction available, Timeout performed and Patient being monitored Patient Re-evaluated:Patient Re-evaluated prior to inductionOxygen Delivery Method: Nasal cannula Placement Confirmation: positive ETCO2     

## 2015-11-06 NOTE — H&P (Signed)
  [facility]  [address], [place], [place] [address] Phone: [phone] Fax : [phone]  Primary Care Physician:  Halina Maidens, MD Primary Gastroenterologist:  Dr. Allen Norris  Pre-Procedure History & Physical: HPI:  Dove Crossno is a 65 y.o. female is here for an endoscopy and colonoscopy.   Past Medical History  Diagnosis Date  . Back pain   . Chronic urinary tract infection     Past Surgical History  Procedure Laterality Date  . Cholecystectomy    . Appendectomy    . Abdominal hysterectomy      ovaries remain  . Carpal tunnel release      Prior to Admission medications   Medication Sig Start Date End Date Taking? Authorizing Provider  cetirizine (ZYRTEC) 10 MG tablet Take 10 mg by mouth daily.   Yes Historical Provider, MD  ciprofloxacin (CIPRO) 500 MG tablet Take 1 tablet (500 mg total) by mouth 2 (two) times daily. 10/07/15  Yes Glean Hess, MD  etodolac (LODINE) 300 MG capsule Take 1 capsule (300 mg total) by mouth every 8 (eight) hours. 10/06/15   Glean Hess, MD  methocarbamol (ROBAXIN) 500 MG tablet Take 1 tablet (500 mg total) by mouth 3 (three) times daily. 10/06/15   Glean Hess, MD  metroNIDAZOLE (FLAGYL) 500 MG tablet Take 1 tablet (500 mg total) by mouth 3 (three) times daily. Patient not taking: Reported on 10/17/2015 10/07/15   Glean Hess, MD  omeprazole (PRILOSEC) 20 MG capsule Take 1 capsule (20 mg total) by mouth daily. 10/07/15   Glean Hess, MD  polyethylene glycol (GOLYTELY) 236 g solution Take 4,000 mLs by mouth once. Drink one 8 oz glass every 30 mins until stools are clear 11/04/15   Lucilla Lame, MD    Allergies as of 10/17/2015  . (No Known Allergies)    Family History  Problem Relation Age of Onset  . Throat cancer Mother   . CAD Father   . Diabetes Brother     Social History   Social History  . Marital Status: Single    Spouse Name: N/A  . Number of Children: N/A  . Years of Education:  N/A   Occupational History  . Not on file.   Social History Main Topics  . Smoking status: Never Smoker   . Smokeless tobacco: Not on file  . Alcohol Use: No  . Drug Use: No  . Sexual Activity: Not on file   Other Topics Concern  . Not on file   Social History Narrative    Review of Systems: See HPI, otherwise negative ROS  Physical Exam: There were no vitals taken for this visit. General:   Alert,  pleasant and cooperative in NAD Head:  Normocephalic and atraumatic. Neck:  Supple; no masses or thyromegaly. Lungs:  Clear throughout to auscultation.    Heart:  Regular rate and rhythm. Abdomen:  Soft, nontender and nondistended. Normal bowel sounds, without guarding, and without rebound.   Neurologic:  Alert and  oriented x4;  grossly normal neurologically.  Impression/Plan: Zaaliyah Ottum is here for an endoscopy and colonoscopy to be performed for nausea early satiety and hematochezia.  Risks, benefits, limitations, and alternatives regarding  endoscopy and colonoscopy have been reviewed with the patient.  Questions have been answered.  All parties agreeable.   Ollen Bowl, MD  11/06/2015, 7:46 AM

## 2015-11-06 NOTE — Anesthesia Preprocedure Evaluation (Signed)
Anesthesia Evaluation  Patient identified by MRN, date of birth, ID band Patient awake    Reviewed: Allergy & Precautions, H&P , NPO status , Patient's Chart, lab work & pertinent test results, reviewed documented beta blocker date and time   Airway Mallampati: II  TM Distance: >3 FB Neck ROM: full    Dental no notable dental hx.    Pulmonary neg pulmonary ROS,    Pulmonary exam normal breath sounds clear to auscultation       Cardiovascular Exercise Tolerance: Good negative cardio ROS   Rhythm:regular Rate:Normal     Neuro/Psych negative neurological ROS  negative psych ROS   GI/Hepatic negative GI ROS, Neg liver ROS,   Endo/Other  negative endocrine ROS  Renal/GU negative Renal ROS  negative genitourinary   Musculoskeletal   Abdominal   Peds  Hematology negative hematology ROS (+)   Anesthesia Other Findings   Reproductive/Obstetrics negative OB ROS                             Anesthesia Physical Anesthesia Plan  ASA: II  Anesthesia Plan: MAC   Post-op Pain Management:    Induction:   Airway Management Planned:   Additional Equipment:   Intra-op Plan:   Post-operative Plan:   Informed Consent: I have reviewed the patients History and Physical, chart, labs and discussed the procedure including the risks, benefits and alternatives for the proposed anesthesia with the patient or authorized representative who has indicated his/her understanding and acceptance.   Dental Advisory Given  Plan Discussed with: CRNA  Anesthesia Plan Comments:         Anesthesia Quick Evaluation  

## 2015-11-06 NOTE — Anesthesia Postprocedure Evaluation (Signed)
Anesthesia Post Note  Patient: Stephanie Ferguson  Procedure(s) Performed: Procedure(s) (LRB): COLONOSCOPY WITH PROPOFOL (N/A) ESOPHAGOGASTRODUODENOSCOPY (EGD) WITH PROPOFOL (N/A) POLYPECTOMY  Patient location during evaluation: PACU Anesthesia Type: MAC Level of consciousness: awake and alert Pain management: pain level controlled Vital Signs Assessment: post-procedure vital signs reviewed and stable Respiratory status: spontaneous breathing, nonlabored ventilation and respiratory function stable Cardiovascular status: blood pressure returned to baseline and stable Postop Assessment: no signs of nausea or vomiting Anesthetic complications: no    Anchor Stephanie Ferguson

## 2015-11-07 ENCOUNTER — Encounter: Payer: Self-pay | Admitting: Gastroenterology

## 2015-11-11 ENCOUNTER — Encounter: Payer: Self-pay | Admitting: Gastroenterology

## 2015-11-12 ENCOUNTER — Other Ambulatory Visit: Payer: Self-pay

## 2015-11-12 ENCOUNTER — Encounter: Payer: Self-pay | Admitting: Gastroenterology

## 2015-11-12 DIAGNOSIS — A048 Other specified bacterial intestinal infections: Secondary | ICD-10-CM

## 2015-11-12 MED ORDER — AMOXICILLIN 500 MG PO CAPS: 1000.0000 mg | ORAL_CAPSULE | Freq: Two times a day (BID) | ORAL | Status: DC

## 2015-11-12 MED ORDER — CLARITHROMYCIN 500 MG PO TABS: 500.0000 mg | ORAL_TABLET | Freq: Two times a day (BID) | ORAL | Status: DC

## 2015-12-10 ENCOUNTER — Ambulatory Visit (INDEPENDENT_AMBULATORY_CARE_PROVIDER_SITE_OTHER): Payer: BLUE CROSS/BLUE SHIELD | Admitting: Internal Medicine

## 2015-12-10 ENCOUNTER — Other Ambulatory Visit: Payer: Self-pay | Admitting: Internal Medicine

## 2015-12-10 ENCOUNTER — Encounter: Payer: Self-pay | Admitting: Internal Medicine

## 2015-12-10 VITALS — BP 122/82 | HR 84 | Temp 98.6°F | Resp 16 | Ht 63.0 in | Wt 239.0 lb

## 2015-12-10 DIAGNOSIS — Z0000 Encounter for general adult medical examination without abnormal findings: Secondary | ICD-10-CM | POA: Diagnosis not present

## 2015-12-10 DIAGNOSIS — Z1159 Encounter for screening for other viral diseases: Secondary | ICD-10-CM | POA: Diagnosis not present

## 2015-12-10 DIAGNOSIS — E785 Hyperlipidemia, unspecified: Secondary | ICD-10-CM | POA: Diagnosis not present

## 2015-12-10 DIAGNOSIS — Z1239 Encounter for other screening for malignant neoplasm of breast: Secondary | ICD-10-CM

## 2015-12-10 DIAGNOSIS — B9681 Helicobacter pylori [H. pylori] as the cause of diseases classified elsewhere: Secondary | ICD-10-CM | POA: Diagnosis not present

## 2015-12-10 DIAGNOSIS — K2970 Gastritis, unspecified, without bleeding: Secondary | ICD-10-CM

## 2015-12-10 DIAGNOSIS — R300 Dysuria: Secondary | ICD-10-CM | POA: Diagnosis not present

## 2015-12-10 LAB — POC URINALYSIS WITH MICROSCOPIC (NON AUTO)MANUAL RESULT
Bilirubin, UA: NEGATIVE
Crystals: NEGATIVE
EPITHELIAL CELLS, URINE PER MICROSCOPY: NEGATIVE
Glucose, UA: NEGATIVE
KETONES UA: NEGATIVE
MUCUS UA: NEGATIVE
PH UA: 6
PROTEIN UA: NEGATIVE
RBC: 5 M/uL (ref 4.04–5.48)
Spec Grav, UA: 1.02
Urobilinogen, UA: 0.2

## 2015-12-10 MED ORDER — PANTOPRAZOLE SODIUM 40 MG PO TBEC: 40.0000 mg | DELAYED_RELEASE_TABLET | Freq: Every day | ORAL | Status: AC

## 2015-12-10 MED ORDER — NITROFURANTOIN MONOHYD MACRO 100 MG PO CAPS: 100.0000 mg | ORAL_CAPSULE | Freq: Two times a day (BID) | ORAL | Status: DC

## 2015-12-10 NOTE — Patient Instructions (Signed)
Breast Self-Awareness Practicing breast self-awareness may pick up problems early, prevent significant medical complications, and possibly save your life. By practicing breast self-awareness, you can become familiar with how your breasts look and feel and if your breasts are changing. This allows you to notice changes early. It can also offer you some reassurance that your breast health is good. One way to learn what is normal for your breasts and whether your breasts are changing is to do a breast self-exam. If you find a lump or something that was not present in the past, it is best to contact your caregiver right away. Other findings that should be evaluated by your caregiver include nipple discharge, especially if it is bloody; skin changes or reddening; areas where the skin seems to be pulled in (retracted); or new lumps and bumps. Breast pain is seldom associated with cancer (malignancy), but should also be evaluated by a caregiver. HOW TO PERFORM A BREAST SELF-EXAM The best time to examine your breasts is 5-7 days after your menstrual period is over. During menstruation, the breasts are lumpier, and it may be more difficult to pick up changes. If you do not menstruate, have reached menopause, or had your uterus removed (hysterectomy), you should examine your breasts at regular intervals, such as monthly. If you are breastfeeding, examine your breasts after a feeding or after using a breast pump. Breast implants do not decrease the risk for lumps or tumors, so continue to perform breast self-exams as recommended. Talk to your caregiver about how to determine the difference between the implant and breast tissue. Also, talk about the amount of pressure you should use during the exam. Over time, you will become more familiar with the variations of your breasts and more comfortable with the exam. A breast self-exam requires you to remove all your clothes above the waist. 1. Look at your breasts and nipples.  Stand in front of a mirror in a room with good lighting. With your hands on your hips, push your hands firmly downward. Look for a difference in shape, contour, and size from one breast to the other (asymmetry). Asymmetry includes puckers, dips, or bumps. Also, look for skin changes, such as reddened or scaly areas on the breasts. Look for nipple changes, such as discharge, dimpling, repositioning, or redness. 2. Carefully feel your breasts. This is best done either in the shower or tub while using soapy water or when flat on your back. Place the arm (on the side of the breast you are examining) above your head. Use the pads (not the fingertips) of your three middle fingers on your opposite hand to feel your breasts. Start in the underarm area and use  inch (2 cm) overlapping circles to feel your breast. Use 3 different levels of pressure (light, medium, and firm pressure) at each circle before moving to the next circle. The light pressure is needed to feel the tissue closest to the skin. The medium pressure will help to feel breast tissue a little deeper, while the firm pressure is needed to feel the tissue close to the ribs. Continue the overlapping circles, moving downward over the breast until you feel your ribs below your breast. Then, move one finger-width towards the center of the body. Continue to use the  inch (2 cm) overlapping circles to feel your breast as you move slowly up toward the collar bone (clavicle) near the base of the neck. Continue the up and down exam using all 3 pressures until you reach the   middle of the chest. Do this with each breast, carefully feeling for lumps or changes. 3.  Keep a written record with breast changes or normal findings for each breast. By writing this information down, you do not need to depend only on memory for size, tenderness, or location. Write down where you are in your menstrual cycle, if you are still menstruating. Breast tissue can have some lumps or  thick tissue. However, see your caregiver if you find anything that concerns you.  SEEK MEDICAL CARE IF:  You see a change in shape, contour, or size of your breasts or nipples.   You see skin changes, such as reddened or scaly areas on the breasts or nipples.   You have an unusual discharge from your nipples.   You feel a new lump or unusually thick areas.    This information is not intended to replace advice given to you by your health care provider. Make sure you discuss any questions you have with your health care provider.   Document Released: 06/28/2005 Document Revised: 06/14/2012 Document Reviewed: 10/13/2011 Elsevier Interactive Patient Education 2016 Elsevier Inc.  

## 2015-12-10 NOTE — Progress Notes (Signed)
Date:  12/10/2015   Name:  Stephanie Ferguson   DOB:  06/25/1951   MRN:  [id number]   Chief Complaint: Annual Exam and Dysuria Stephanie Ferguson is a 65 y.o. female who presents today for her Complete Annual Exam. She feels fairly well. She is still having GI issues. She reports exercising none due to chronic nausea. She reports she is sleeping fairly well. She denies breast issues.  She is due for a Mammogram.  She is s/p partial hysterectomy, ovaries remain.  She has occasional hot flashes but these are much improved.  Dysuria  This is a new problem. The current episode started in the past 7 days. The problem occurs every urination. The problem has been unchanged. The quality of the pain is described as burning. There has been no fever. Associated symptoms include nausea and urgency. Pertinent negatives include no chills, frequency, hematuria or vomiting.   Nausea - persistent symptoms despite 2 treatments for H Pylori gastritis.  She continues on omeprazole daily.   Evaluation has been colonoscopy and EGD.   Review of Systems  Constitutional: Negative for fever, chills and fatigue.  HENT: Negative for congestion, hearing loss, tinnitus, trouble swallowing and voice change.   Eyes: Negative for visual disturbance.  Respiratory: Negative for cough, chest tightness, shortness of breath and wheezing.   Cardiovascular: Negative for chest pain, palpitations and leg swelling.  Gastrointestinal: Positive for nausea. Negative for vomiting, abdominal pain, diarrhea, constipation and blood in stool.  Endocrine: Negative for polydipsia and polyuria.  Genitourinary: Positive for dysuria and urgency. Negative for frequency, hematuria, vaginal bleeding, vaginal discharge, difficulty urinating and genital sores.  Musculoskeletal: Negative for joint swelling, arthralgias and gait problem.  Skin: Negative for color change and rash.  Neurological: Negative for dizziness, tremors, light-headedness and  headaches.  Hematological: Negative for adenopathy. Does not bruise/bleed easily.  Psychiatric/Behavioral: Negative for sleep disturbance and dysphoric mood. The patient is not nervous/anxious.     Patient Active Problem List   Diagnosis Date Noted  . Adenomatous colon polyp   . Early satiety   . Nausea   . Gastritis, Helicobacter pylori   . Sigmoid diverticulosis 10/03/2015  . Fatty liver 10/03/2015  . Hyperlipidemia, mild 10/03/2015    Prior to Admission medications   Medication Sig Start Date End Date Taking? Authorizing Provider  cetirizine (ZYRTEC) 10 MG tablet Take 10 mg by mouth daily.   Yes Historical Provider, MD  Phenazopyridine HCl (URISTAT PO) Take by mouth.   Yes Historical Provider, MD    No Known Allergies  Past Surgical History  Procedure Laterality Date  . Cholecystectomy    . Appendectomy    . Abdominal hysterectomy      ovaries remain  . Carpal tunnel release    . Colonoscopy with propofol N/A 11/06/2015    Procedure: COLONOSCOPY WITH PROPOFOL;  Surgeon: Lucilla Lame, MD;  Location: [facility];  Service: Endoscopy;  Laterality: N/A;  . Esophagogastroduodenoscopy (egd) with propofol N/A 11/06/2015    Procedure: ESOPHAGOGASTRODUODENOSCOPY (EGD) WITH PROPOFOL;  Surgeon: Lucilla Lame, MD;  Location: [facility];  Service: Endoscopy;  Laterality: N/A;  . Polypectomy  11/06/2015    Procedure: POLYPECTOMY;  Surgeon: Lucilla Lame, MD;  Location: [place];  Service: Endoscopy;;    Social History  Substance Use Topics  . Smoking status: Never Smoker   . Smokeless tobacco: None  . Alcohol Use: No     Medication list has been reviewed and updated.   Physical  Exam  Constitutional: She is oriented to person, place, and time. She appears well-developed and well-nourished. No distress.  HENT:  Head: Normocephalic and atraumatic.  Right Ear: Tympanic membrane and ear canal normal.  Left Ear: Tympanic membrane and ear canal normal.  Nose:  Right sinus exhibits no maxillary sinus tenderness. Left sinus exhibits no maxillary sinus tenderness.  Mouth/Throat: Uvula is midline and oropharynx is clear and moist.  Eyes: Conjunctivae and EOM are normal. Right eye exhibits no discharge. Left eye exhibits no discharge. No scleral icterus.  Neck: Normal range of motion. Carotid bruit is not present. No erythema present. No thyromegaly present.  Cardiovascular: Normal rate, regular rhythm, normal heart sounds and normal pulses.   Pulmonary/Chest: Effort normal. No respiratory distress. She has no wheezes. Right breast exhibits no mass, no nipple discharge, no skin change and no tenderness. Left breast exhibits no mass, no nipple discharge, no skin change and no tenderness.  Abdominal: Soft. Bowel sounds are normal. There is no hepatosplenomegaly. There is tenderness in the suprapubic area. There is CVA tenderness. There is no rigidity and no guarding.  Genitourinary: There is no tenderness, lesion or injury on the right labia. There is no tenderness, lesion or injury on the left labia. Right adnexum displays no mass, no tenderness and no fullness. Left adnexum displays no mass, no tenderness and no fullness.  Musculoskeletal: Normal range of motion.  Lymphadenopathy:    She has no cervical adenopathy.    She has no axillary adenopathy.  Neurological: She is alert and oriented to person, place, and time. She has normal reflexes. No cranial nerve deficit or sensory deficit.  Skin: Skin is warm, dry and intact. No rash noted.  Psychiatric: She has a normal mood and affect. Her speech is normal and behavior is normal. Thought content normal.  Nursing note and vitals reviewed.   BP 122/82 mmHg  Pulse 84  Temp(Src) 98.6 F (37 C) (Oral)  Resp 16  Ht 5\' 3"  (1.6 m)  Wt 239 lb (108.41 kg)  BMI 42.35 kg/m2  SpO2 97%  Assessment and Plan: 1. Annual physical exam Labs updated - CMP, CBC and TSH normal recently  2. Dysuria - POC urinalysis w  microscopic (non auto) - Urine Culture - nitrofurantoin, macrocrystal-monohydrate, (MACROBID) 100 MG capsule; Take 1 capsule (100 mg total) by mouth 2 (two) times daily.  Dispense: 20 capsule; Refill: 0  3. Gastritis, Helicobacter pylori Change omeprazole to protonix  Recommend follow up with Dr. Allen Norris - pantoprazole (PROTONIX) 40 MG tablet; Take 1 tablet (40 mg total) by mouth daily.  Dispense: 30 tablet; Refill: 3  4. Hyperlipidemia, mild Will advise - Lipid panel  5. Breast cancer screening - [facility] DIGITAL SCREENING BILATERAL; Future  6. Need for hepatitis C screening test - Hepatitis C antibody   Halina Maidens, MD [facility]  12/10/2015

## 2015-12-11 LAB — HEPATITIS C ANTIBODY: Hep C Virus Ab: <0.1 s/co ratio (ref 0.0–0.9)

## 2015-12-11 LAB — LIPID PANEL
CHOLESTEROL TOTAL: 218 mg/dL — AB (ref 100–199)
Chol/HDL Ratio: 5.3 ratio units — ABNORMAL HIGH (ref 0.0–4.4)
HDL: 41 mg/dL (ref >39)
LDL Calculated: 149 mg/dL — ABNORMAL HIGH (ref 0–99)
TRIGLYCERIDES: 138 mg/dL (ref 0–149)
VLDL Cholesterol Cal: 28 mg/dL (ref 5–40)

## 2015-12-12 LAB — URINE CULTURE

## 2015-12-12 LAB — PLEASE NOTE

## 2018-04-25 DIAGNOSIS — Z23 Encounter for immunization: Secondary | ICD-10-CM | POA: Diagnosis not present

## 2018-05-17 ENCOUNTER — Ambulatory Visit
Admission: EM | Admit: 2018-05-17 | Discharge: 2018-05-17 | Disposition: A | Discharge status: Home / Self Care | Payer: BLUE CROSS/BLUE SHIELD | Attending: Family Medicine | Admitting: Family Medicine

## 2018-05-17 DIAGNOSIS — J9801 Acute bronchospasm: Secondary | ICD-10-CM

## 2018-05-17 DIAGNOSIS — R059 Cough, unspecified: Secondary | ICD-10-CM

## 2018-05-17 DIAGNOSIS — R05 Cough: Secondary | ICD-10-CM

## 2018-05-17 MED ORDER — PREDNISONE 10 MG PO TABS: ORAL_TABLET | ORAL | 0 refills | Status: DC

## 2018-05-17 MED ORDER — DOXYCYCLINE HYCLATE 100 MG PO CAPS: 100.0000 mg | ORAL_CAPSULE | Freq: Two times a day (BID) | ORAL | 0 refills | Status: DC

## 2018-05-17 MED ORDER — ALBUTEROL SULFATE HFA 108 (90 BASE) MCG/ACT IN AERS: 2.0000 | INHALATION_SPRAY | RESPIRATORY_TRACT | 0 refills | Status: DC | PRN

## 2018-05-17 MED ORDER — HYDROCOD POLST-CPM POLST ER 10-8 MG/5ML PO SUER: 5.0000 mL | Freq: Every evening | ORAL | 0 refills | Status: DC | PRN

## 2018-05-17 NOTE — ED Provider Notes (Signed)
[facility] ____________________________________________  Time seen: Approximately 5:43 PM  I have reviewed the triage vital signs and the nursing notes.   HISTORY  Chief Complaint Cough   HPI Stephanie Ferguson is a 67 y.o. female presenting for evaluation of 1 week of runny nose, nasal congestion, cough.  Patient reports intermittent sinus pressure pain.  States she feels like some of the nasal congestion has improved but cough has increased.  States cough is now more of a dry hacking cough and productive.  States occasionally hears herself wheeze at night.  Tends to cough in fits, particularly after deep breath or laughing.  Denies sore throat.  Has continued to eat and drink well.  Denies known fevers.  States cough is disrupting sleep.  Unresolved with over-the-counter cough and congestion medication.  Denies other aggravating alleviating factors.  Continues remain active.  Reports some recent sick contacts with similar.  Denies COPD, asthma or current lung infections.  No recent antibiotic use.  Denies complaint chest pain, shortness of breath, abdominal pain, extremity pain or extremity swelling.  Glean Hess, MD: PCP   Past Medical History:  Diagnosis Date  . Back pain   . Chronic urinary tract infection     Patient Active Problem List   Diagnosis Date Noted  . Adenomatous colon polyp   . Early satiety   . Nausea   . Gastritis, Helicobacter pylori   . Sigmoid diverticulosis 10/03/2015  . Fatty liver 10/03/2015  . Hyperlipidemia, mild 10/03/2015    Past Surgical History:  Procedure Laterality Date  . ABDOMINAL HYSTERECTOMY     ovaries remain  . APPENDECTOMY    . CARPAL TUNNEL RELEASE    . CHOLECYSTECTOMY    . COLONOSCOPY WITH PROPOFOL N/A 11/06/2015   Procedure: COLONOSCOPY WITH PROPOFOL;  Surgeon: Lucilla Lame, MD;  Location: [place];  Service: Endoscopy;  Laterality: N/A;  . ESOPHAGOGASTRODUODENOSCOPY (EGD) WITH PROPOFOL N/A 11/06/2015    Procedure: ESOPHAGOGASTRODUODENOSCOPY (EGD) WITH PROPOFOL;  Surgeon: Lucilla Lame, MD;  Location: [place];  Service: Endoscopy;  Laterality: N/A;  . POLYPECTOMY  11/06/2015   Procedure: POLYPECTOMY;  Surgeon: Lucilla Lame, MD;  Location: [place];  Service: Endoscopy;;     No current facility-administered medications for this encounter.   Current Outpatient Medications:  .  albuterol (PROVENTIL HFA;VENTOLIN HFA) 108 (90 Base) MCG/ACT inhaler, Inhale 2 puffs into the lungs every 4 (four) hours as needed for wheezing., Disp: 1 Inhaler, Rfl: 0 .  cetirizine (ZYRTEC) 10 MG tablet, Take 10 mg by mouth daily., Disp: , Rfl:  .  chlorpheniramine-HYDROcodone (TUSSIONEX PENNKINETIC ER) 10-8 MG/5ML SUER, Take 5 mLs by mouth at bedtime as needed for cough. do not drive or operate machinery while taking as can cause drowsiness., Disp: 50 mL, Rfl: 0 .  doxycycline (VIBRAMYCIN) 100 MG capsule, Take 1 capsule (100 mg total) by mouth 2 (two) times daily., Disp: 20 capsule, Rfl: 0 .  nitrofurantoin, macrocrystal-monohydrate, (MACROBID) 100 MG capsule, Take 1 capsule (100 mg total) by mouth 2 (two) times daily., Disp: 20 capsule, Rfl: 0 .  pantoprazole (PROTONIX) 40 MG tablet, Take 1 tablet (40 mg total) by mouth daily., Disp: 30 tablet, Rfl: 3 .  Phenazopyridine HCl (URISTAT PO), Take by mouth., Disp: , Rfl:  .  predniSONE (DELTASONE) 10 MG tablet, Start 60 mg po day one, then 50 mg po day two, taper by 10 mg daily until complete., Disp: 21 tablet, Rfl: 0  Allergies Patient has no known allergies.  Family History  Problem Relation Age of Onset  . Throat cancer Mother   . CAD Father   . Diabetes Brother     Social History Social History   Tobacco Use  . Smoking status: Never Smoker  . Smokeless tobacco: Never Used  Substance Use Topics  . Alcohol use: No    Alcohol/week: 0.0 standard drinks  . Drug use: No    Review of Systems Constitutional: No fever ENT: No sore  throat. As above.  Cardiovascular: Denies chest pain. Respiratory: Denies shortness of breath. Gastrointestinal: No abdominal pain.   Musculoskeletal: Negative for back pain. Skin: Negative for rash.  ____________________________________________   PHYSICAL EXAM:  VITAL SIGNS: ED Triage Vitals  Enc Vitals Group     BP 05/17/18 1708 (!) 144/55     Pulse Rate 05/17/18 1708 92     Resp 05/17/18 1708 18     Temp 05/17/18 1708 98.9 F (37.2 C)     Temp Source 05/17/18 1708 Oral     SpO2 05/17/18 1708 97 %     Weight --      Height --      Head Circumference --      Peak Flow --      Pain Score 05/17/18 1709 5     Pain Loc --      Pain Edu? --      Excl. in [place]? --     Constitutional: Alert and oriented. Well appearing and in no acute distress. Eyes: Conjunctivae are normal.  Head: Atraumatic. No sinus tenderness to palpation. No swelling. No erythema.  Ears: no erythema, normal TMs bilaterally.   Nose:Nasal congestion   Mouth/Throat: Mucous membranes are moist. No pharyngeal erythema. No tonsillar swelling or exudate.  Neck: No stridor.  No cervical spine tenderness to palpation. Hematological/Lymphatic/Immunilogical: No cervical lymphadenopathy. Cardiovascular: Normal rate, regular rhythm. Grossly normal heart sounds.  Good peripheral circulation. Respiratory: Normal respiratory effort.  No retractions.  Mild scattered rhonchi.  No wheezes.  Dry intermittent cough with bronchospasm present.  Speaks in complete sentences.  Good air movement.  Musculoskeletal: Ambulatory with steady gait. Neurologic:  Normal speech and language. No gait instability. Skin:  Skin appears warm, dry and intact. No rash noted. Psychiatric: Mood and affect are normal. Speech and behavior are normal. ______________________________________   LABS (all labs ordered are listed, but only abnormal results are displayed)  Labs Reviewed - No data to display   PROCEDURES Procedures   INITIAL  IMPRESSION / ASSESSMENT AND PLAN / ED COURSE  Pertinent labs & imaging results that were available during my care of the patient were reviewed by me and considered in my medical decision making (see chart for details).  Overall well-appearing patient.  No acute distress.  Intermittent bronchospasm noted.  Will treat with prednisone taper, PRN albuterol inhaler, PRN Tussionex at night, continue over-the-counter during daytime and oral doxycycline.  Encourage rest, fluids, supportive care.  Discussed her follow-up and return parameters.Discussed indication, risks and benefits of medications with patient.  Discussed follow up with Primary care physician this week. Discussed follow up and return parameters including no resolution or any worsening concerns. Patient verbalized understanding and agreed to plan.   ____________________________________________   FINAL CLINICAL IMPRESSION(S) / ED DIAGNOSES  Final diagnoses:  Cough  Bronchospasm     ED Discharge Orders         Ordered    predniSONE (DELTASONE) 10 MG tablet     05/17/18 1745    albuterol (PROVENTIL  HFA;VENTOLIN HFA) 108 (90 Base) MCG/ACT inhaler  Every 4 hours PRN     05/17/18 1745    doxycycline (VIBRAMYCIN) 100 MG capsule  2 times daily     05/17/18 1745    chlorpheniramine-HYDROcodone (TUSSIONEX PENNKINETIC ER) 10-8 MG/5ML SUER  At bedtime PRN     05/17/18 1745           Note: This dictation was prepared with Dragon dictation along with smaller phrase technology. Any transcriptional errors that result from this process are unintentional.         Marylene Land, NP 05/17/18 1922

## 2018-05-17 NOTE — Discharge Instructions (Signed)
Take medication as prescribed. Rest. Drink plenty of fluids.  ° °Follow up with your primary care physician this week as needed. Return to Urgent care for new or worsening concerns.  ° °

## 2018-05-17 NOTE — ED Triage Notes (Signed)
Pt here for cough since Thursday of last week. Started off productive and has turned into a dry cough that's worse at night. Has been taking mucinex without relief. No fever reported. States she finds to hard to breathe after a long fit of coughing.

## 2018-05-26 ENCOUNTER — Ambulatory Visit (INDEPENDENT_AMBULATORY_CARE_PROVIDER_SITE_OTHER): Payer: Medicare HMO

## 2018-05-26 ENCOUNTER — Ambulatory Visit
Admission: EM | Admit: 2018-05-26 | Discharge: 2018-05-26 | Disposition: A | Discharge status: Home / Self Care | Payer: Medicare HMO | Attending: Family Medicine | Admitting: Family Medicine

## 2018-05-26 ENCOUNTER — Encounter: Payer: Self-pay | Admitting: Emergency Medicine

## 2018-05-26 DIAGNOSIS — J40 Bronchitis, not specified as acute or chronic: Secondary | ICD-10-CM

## 2018-05-26 DIAGNOSIS — R05 Cough: Secondary | ICD-10-CM | POA: Diagnosis not present

## 2018-05-26 MED ORDER — IPRATROPIUM-ALBUTEROL 0.5-2.5 (3) MG/3ML IN SOLN
3.0000 mL | Freq: Once | RESPIRATORY_TRACT | Status: AC
  Administered 2018-05-26: 3 mL via RESPIRATORY_TRACT

## 2018-05-26 MED ORDER — PREDNISONE 20 MG PO TABS: ORAL_TABLET | ORAL | 0 refills | Status: DC

## 2018-05-26 NOTE — ED Provider Notes (Addendum)
[facility]    CSN: [id number] Arrival date & time: 05/26/18  1307     History   Chief Complaint Chief Complaint  Patient presents with  . Cough    HPI Stephanie Ferguson is a 67 y.o. female.   HPI  Is a 67 year old female with a nonproductive cough that started a week ago.  Seen here 05/17/18 dx. with bronchospasm and cough.  She was treated with prednisone taper, doxycyclidine which she has 1 more day left, tussionex, albuterol.  By taking the medication she has found that nothing has been working.  She has worsened.  Afebrile.  O2 sats are 99% on room air.  She does not appear breathless.  Having paroxysms of coughing in the room.          Past Medical History:  Diagnosis Date  . Back pain   . Chronic urinary tract infection     Patient Active Problem List   Diagnosis Date Noted  . Adenomatous colon polyp   . Early satiety   . Nausea   . Gastritis, Helicobacter pylori   . Sigmoid diverticulosis 10/03/2015  . Fatty liver 10/03/2015  . Hyperlipidemia, mild 10/03/2015    Past Surgical History:  Procedure Laterality Date  . ABDOMINAL HYSTERECTOMY     ovaries remain  . APPENDECTOMY    . CARPAL TUNNEL RELEASE    . CHOLECYSTECTOMY    . COLONOSCOPY WITH PROPOFOL N/A 11/06/2015   Procedure: COLONOSCOPY WITH PROPOFOL;  Surgeon: Lucilla Lame, MD;  Location: [place];  Service: Endoscopy;  Laterality: N/A;  . ESOPHAGOGASTRODUODENOSCOPY (EGD) WITH PROPOFOL N/A 11/06/2015   Procedure: ESOPHAGOGASTRODUODENOSCOPY (EGD) WITH PROPOFOL;  Surgeon: Lucilla Lame, MD;  Location: [place];  Service: Endoscopy;  Laterality: N/A;  . POLYPECTOMY  11/06/2015   Procedure: POLYPECTOMY;  Surgeon: Lucilla Lame, MD;  Location: [place];  Service: Endoscopy;;    OB History   None      Home Medications    Prior to Admission medications   Medication Sig Start Date End Date Taking? Authorizing Provider  albuterol (PROVENTIL HFA;VENTOLIN  HFA) 108 (90 Base) MCG/ACT inhaler Inhale 2 puffs into the lungs every 4 (four) hours as needed for wheezing. 05/17/18   Marylene Land, NP  cetirizine (ZYRTEC) 10 MG tablet Take 10 mg by mouth daily.    [provider]  chlorpheniramine-HYDROcodone (TUSSIONEX PENNKINETIC ER) 10-8 MG/5ML SUER Take 5 mLs by mouth at bedtime as needed for cough. do not drive or operate machinery while taking as can cause drowsiness. 05/17/18   Marylene Land, NP  doxycycline (VIBRAMYCIN) 100 MG capsule Take 1 capsule (100 mg total) by mouth 2 (two) times daily. 05/17/18   Marylene Land, NP  nitrofurantoin, macrocrystal-monohydrate, (MACROBID) 100 MG capsule Take 1 capsule (100 mg total) by mouth 2 (two) times daily. 12/10/15   Glean Hess, MD  pantoprazole (PROTONIX) 40 MG tablet Take 1 tablet (40 mg total) by mouth daily. 12/10/15   Glean Hess, MD  Phenazopyridine HCl (URISTAT PO) Take by mouth.    [provider]  predniSONE (DELTASONE) 20 MG tablet Take 2 tablets (40 mg) daily by mouth 05/26/18   Lorin Picket, PA-C    Family History Family History  Problem Relation Age of Onset  . Throat cancer Mother   . CAD Father   . Diabetes Brother     Social History Social History   Tobacco Use  . Smoking status: Never Smoker  . Smokeless tobacco: Never  Used  Substance Use Topics  . Alcohol use: No    Alcohol/week: 0.0 standard drinks  . Drug use: No     Allergies   Patient has no known allergies.   Review of Systems Review of Systems  Constitutional: Positive for activity change. Negative for chills, fatigue and fever.  HENT: Positive for congestion and postnasal drip.   Respiratory: Positive for cough, shortness of breath and wheezing.   All other systems reviewed and are negative.    Physical Exam Triage Vital Signs ED Triage Vitals  Enc Vitals Group     BP 05/26/18 1322 (!) 150/67     Pulse Rate 05/26/18 1322 87     Resp 05/26/18 1322 20     Temp  05/26/18 1322 98.8 F (37.1 C)     Temp Source 05/26/18 1322 Oral     SpO2 05/26/18 1322 99 %     Weight 05/26/18 1323 260 lb (117.9 kg)     Height 05/26/18 1323 5\' 3"  (1.6 m)     Head Circumference --      Peak Flow --      Pain Score 05/26/18 1323 0     Pain Loc --      Pain Edu? --      Excl. in [place]? --    No data found.  Updated Vital Signs BP (!) 150/67 (BP Location: Right Arm)   Pulse 95   Temp 98.8 F (37.1 C) (Oral)   Resp 20   Ht 5\' 3"  (1.6 m)   Wt 260 lb (117.9 kg)   SpO2 97%   BMI 46.06 kg/m   Visual Acuity Right Eye Distance:   Left Eye Distance:   Bilateral Distance:    Right Eye Near:   Left Eye Near:    Bilateral Near:     Physical Exam  Constitutional: She is oriented to person, place, and time. She appears well-developed and well-nourished. No distress.  HENT:  Head: Normocephalic.  Right Ear: External ear normal.  Left Ear: External ear normal.  Nose: Nose normal.  Mouth/Throat: Oropharynx is clear and moist. No oropharyngeal exudate.  Eyes: Pupils are equal, round, and reactive to light. Right eye exhibits no discharge. Left eye exhibits no discharge.  Neck: Normal range of motion.  Pulmonary/Chest: Effort normal. No stridor. She has wheezes.  Musculoskeletal: Normal range of motion.  Lymphadenopathy:    She has no cervical adenopathy.  Neurological: She is alert and oriented to person, place, and time.  Skin: Skin is warm and dry. She is not diaphoretic.  Psychiatric: She has a normal mood and affect. Her behavior is normal. Judgment and thought content normal.  Nursing note and vitals reviewed.    UC Treatments / Results  Labs (all labs ordered are listed, but only abnormal results are displayed) Labs Reviewed - No data to display  EKG None  Radiology Dg Chest 2 View  Result Date: 05/26/2018 CLINICAL DATA:  Pt states she was diagnosed bronchitis 1 -2 weeks ago but not any better. Mostly dry cough with chest tightness and SOB,  dizziness EXAM: CHEST - 2 VIEW COMPARISON:  None. FINDINGS: The heart size and mediastinal contours are within normal limits. Both lungs are clear. The visualized skeletal structures are unremarkable. IMPRESSION: No active cardiopulmonary disease. Electronically Signed   By: Nolon Nations M.D.   On: 05/26/2018 14:08    Procedures Procedures (including critical care time)  Medications Ordered in UC Medications  ipratropium-albuterol (DUONEB) 0.5-2.5 (3) MG/3ML  nebulizer solution 3 mL (3 mLs Nebulization Given 05/26/18 1351)  ipratropium-albuterol (DUONEB) 0.5-2.5 (3) MG/3ML nebulizer solution 3 mL (3 mLs Nebulization Given 05/26/18 1510)  She felt better and sounded better after her second DuoNeb treatment.  Initial Impression / Assessment and Plan / UC Course  I have reviewed the triage vital signs and the nursing notes.  Pertinent labs & imaging results that were available during my care of the patient were reviewed by me and considered in my medical decision making (see chart for details).   She likely has a viral bronchitis.  Encouraged her to  to use her albuterol inhaler to keep her airways open.  Given her a written prescription for prednisone that she may use if she feels that she would benefit from it.  This can last up to 3 months.  I have asked her to see her primary care physician, Dr. Army Melia next week.  In the meantime she is not improving or worsen she should go immediately to the emergency room   Final Clinical Impressions(s) / UC Diagnoses   Final diagnoses:  Bronchitis     Discharge Instructions     Follow-up with your primary care physician, Dr. Army Melia, next week.  If you have difficulty breathing or worsening go to the emergency room.  Use your albuterol inhaler as necessary for wheezing or shortness of breath.    ED Prescriptions    Medication Sig Dispense Auth. Provider   predniSONE (DELTASONE) 20 MG tablet Take 2 tablets (40 mg) daily by mouth 8 tablet  Lorin Picket, PA-C     Controlled Substance Prescriptions [place] Controlled Substance Registry consulted? Not Applicable   Lorin Picket, PA-C 05/26/18 1549    Lorin Picket, PA-C 05/26/18 1551

## 2018-05-26 NOTE — ED Triage Notes (Signed)
Pt reports worsened non-productive cough that started over 1 week ago. Pt denies known fever reports intermittent SOB associated with the cough. Pt seen here about 1 week ago for same and has completed Abx and Steroid Rx without improvement in symptoms

## 2018-05-26 NOTE — Discharge Instructions (Addendum)
Follow-up with your primary care physician, Dr. Army Melia, next week.  If you have difficulty breathing or worsening go to the emergency room.  Use your albuterol inhaler as necessary for wheezing or shortness of breath.

## 2018-11-28 DIAGNOSIS — K760 Fatty (change of) liver, not elsewhere classified: Secondary | ICD-10-CM | POA: Diagnosis not present

## 2018-11-28 DIAGNOSIS — N393 Stress incontinence (female) (male): Secondary | ICD-10-CM | POA: Diagnosis not present

## 2018-11-28 DIAGNOSIS — Z1159 Encounter for screening for other viral diseases: Secondary | ICD-10-CM | POA: Diagnosis not present

## 2018-11-28 DIAGNOSIS — Z1231 Encounter for screening mammogram for malignant neoplasm of breast: Secondary | ICD-10-CM | POA: Diagnosis not present

## 2018-11-28 DIAGNOSIS — G5603 Carpal tunnel syndrome, bilateral upper limbs: Secondary | ICD-10-CM | POA: Diagnosis not present

## 2018-11-28 DIAGNOSIS — Z87440 Personal history of urinary (tract) infections: Secondary | ICD-10-CM | POA: Diagnosis not present

## 2018-11-28 DIAGNOSIS — M1990 Unspecified osteoarthritis, unspecified site: Secondary | ICD-10-CM | POA: Diagnosis not present

## 2018-11-28 DIAGNOSIS — Z1239 Encounter for other screening for malignant neoplasm of breast: Secondary | ICD-10-CM | POA: Diagnosis not present

## 2018-11-30 DIAGNOSIS — R7303 Prediabetes: Secondary | ICD-10-CM | POA: Diagnosis not present

## 2018-11-30 DIAGNOSIS — K760 Fatty (change of) liver, not elsewhere classified: Secondary | ICD-10-CM | POA: Diagnosis not present

## 2018-11-30 DIAGNOSIS — M1990 Unspecified osteoarthritis, unspecified site: Secondary | ICD-10-CM | POA: Diagnosis not present

## 2018-11-30 DIAGNOSIS — Z6841 Body Mass Index (BMI) 40.0 and over, adult: Secondary | ICD-10-CM | POA: Diagnosis not present

## 2018-11-30 DIAGNOSIS — Z1159 Encounter for screening for other viral diseases: Secondary | ICD-10-CM | POA: Diagnosis not present

## 2019-01-09 DIAGNOSIS — Z1231 Encounter for screening mammogram for malignant neoplasm of breast: Secondary | ICD-10-CM | POA: Diagnosis not present

## 2019-01-09 DIAGNOSIS — Z1239 Encounter for other screening for malignant neoplasm of breast: Secondary | ICD-10-CM | POA: Diagnosis not present

## 2019-06-13 DIAGNOSIS — K760 Fatty (change of) liver, not elsewhere classified: Secondary | ICD-10-CM | POA: Diagnosis not present

## 2019-06-13 DIAGNOSIS — R05 Cough: Secondary | ICD-10-CM | POA: Diagnosis not present

## 2019-06-13 DIAGNOSIS — M1990 Unspecified osteoarthritis, unspecified site: Secondary | ICD-10-CM | POA: Diagnosis not present

## 2019-06-13 DIAGNOSIS — E669 Obesity, unspecified: Secondary | ICD-10-CM | POA: Diagnosis not present

## 2019-06-13 DIAGNOSIS — E785 Hyperlipidemia, unspecified: Secondary | ICD-10-CM | POA: Diagnosis not present

## 2019-06-13 DIAGNOSIS — U071 COVID-19: Secondary | ICD-10-CM | POA: Diagnosis not present

## 2019-06-20 DIAGNOSIS — Z20828 Contact with and (suspected) exposure to other viral communicable diseases: Secondary | ICD-10-CM | POA: Diagnosis not present

## 2019-10-24 DIAGNOSIS — R102 Pelvic and perineal pain: Secondary | ICD-10-CM | POA: Diagnosis not present

## 2019-11-08 DIAGNOSIS — E785 Hyperlipidemia, unspecified: Secondary | ICD-10-CM | POA: Diagnosis not present

## 2019-11-08 DIAGNOSIS — Z6841 Body Mass Index (BMI) 40.0 and over, adult: Secondary | ICD-10-CM | POA: Diagnosis not present

## 2019-11-08 DIAGNOSIS — R079 Chest pain, unspecified: Secondary | ICD-10-CM | POA: Diagnosis not present

## 2019-11-08 DIAGNOSIS — R011 Cardiac murmur, unspecified: Secondary | ICD-10-CM | POA: Diagnosis not present

## 2019-11-08 DIAGNOSIS — R7303 Prediabetes: Secondary | ICD-10-CM | POA: Diagnosis not present

## 2019-11-08 DIAGNOSIS — Z9049 Acquired absence of other specified parts of digestive tract: Secondary | ICD-10-CM | POA: Diagnosis not present

## 2019-11-08 DIAGNOSIS — Z8249 Family history of ischemic heart disease and other diseases of the circulatory system: Secondary | ICD-10-CM | POA: Diagnosis not present

## 2019-11-08 DIAGNOSIS — M1990 Unspecified osteoarthritis, unspecified site: Secondary | ICD-10-CM | POA: Diagnosis not present

## 2019-11-13 DIAGNOSIS — R011 Cardiac murmur, unspecified: Secondary | ICD-10-CM | POA: Diagnosis not present

## 2019-11-13 DIAGNOSIS — R079 Chest pain, unspecified: Secondary | ICD-10-CM | POA: Diagnosis not present

## 2020-04-10 ENCOUNTER — Other Ambulatory Visit: Payer: Self-pay

## 2020-04-10 ENCOUNTER — Ambulatory Visit
Admission: EM | Admit: 2020-04-10 | Discharge: 2020-04-10 | Disposition: A | Discharge status: Home / Self Care | Payer: Medicare HMO | Attending: Internal Medicine | Admitting: Internal Medicine

## 2020-04-10 DIAGNOSIS — E785 Hyperlipidemia, unspecified: Secondary | ICD-10-CM | POA: Diagnosis not present

## 2020-04-10 DIAGNOSIS — Z79899 Other long term (current) drug therapy: Secondary | ICD-10-CM | POA: Insufficient documentation

## 2020-04-10 DIAGNOSIS — R05 Cough: Secondary | ICD-10-CM | POA: Insufficient documentation

## 2020-04-10 DIAGNOSIS — K760 Fatty (change of) liver, not elsewhere classified: Secondary | ICD-10-CM | POA: Diagnosis not present

## 2020-04-10 DIAGNOSIS — Z20822 Contact with and (suspected) exposure to covid-19: Secondary | ICD-10-CM | POA: Insufficient documentation

## 2020-04-10 DIAGNOSIS — B349 Viral infection, unspecified: Secondary | ICD-10-CM | POA: Diagnosis not present

## 2020-04-10 DIAGNOSIS — R509 Fever, unspecified: Secondary | ICD-10-CM | POA: Insufficient documentation

## 2020-04-10 DIAGNOSIS — R062 Wheezing: Secondary | ICD-10-CM | POA: Diagnosis not present

## 2020-04-10 LAB — SARS CORONAVIRUS 2 (TAT 6-24 HRS): SARS Coronavirus 2: NEGATIVE

## 2020-04-10 MED ORDER — ALBUTEROL SULFATE HFA 108 (90 BASE) MCG/ACT IN AERS: 2.0000 | INHALATION_SPRAY | RESPIRATORY_TRACT | 0 refills | Status: DC | PRN

## 2020-04-10 MED ORDER — PREDNISONE 20 MG PO TABS: 20.0000 mg | ORAL_TABLET | Freq: Every day | ORAL | 0 refills | Status: DC

## 2020-04-10 NOTE — ED Triage Notes (Signed)
Pt reports having a productive cough that began last night, also have headache and sore throat. No other symptoms. Pt used inhaler with relief.

## 2020-04-10 NOTE — Discharge Instructions (Addendum)
Due to your BMI being in the obese side and if your covid test is positive, you qualify to receive Monoclonal Antibody infusion and if you don't hear from anyone within 24 hours of finding your results, call [phone]

## 2020-04-10 NOTE — ED Provider Notes (Signed)
[facility]    CSN: [id number] Arrival date & time: 04/10/20  6144      History   Chief Complaint Chief Complaint  Patient presents with  . Cough    HPI Stephanie Ferguson is a 69 y.o. female who present with hx of nasal congestion x 2 weeks and thought it was allergies. Onset of cough yesterday, and this am woke up with a fever of 100.2 and she has been having a HA and ST since last night. She tried her inhaler which was given to her last year and it helped the cough today. She has continued her daily acitivies. 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  Has had Moderna injections this year.    Past Medical History:  Diagnosis Date  . Back pain   . Chronic urinary tract infection     Patient Active Problem List   Diagnosis Date Noted  . Adenomatous colon polyp   . Early satiety   . Nausea   . Gastritis, Helicobacter pylori   . Sigmoid diverticulosis 10/03/2015  . Fatty liver 10/03/2015  . Hyperlipidemia, mild 10/03/2015    Past Surgical History:  Procedure Laterality Date  . ABDOMINAL HYSTERECTOMY     ovaries remain  . APPENDECTOMY    . CARPAL TUNNEL RELEASE    . CHOLECYSTECTOMY    . COLONOSCOPY WITH PROPOFOL N/A 11/06/2015   Procedure: COLONOSCOPY WITH PROPOFOL;  Surgeon: Lucilla Lame, MD;  Location: [place];  Service: Endoscopy;  Laterality: N/A;  . ESOPHAGOGASTRODUODENOSCOPY (EGD) WITH PROPOFOL N/A 11/06/2015   Procedure: ESOPHAGOGASTRODUODENOSCOPY (EGD) WITH PROPOFOL;  Surgeon: Lucilla Lame, MD;  Location: [place];  Service: Endoscopy;  Laterality: N/A;  . POLYPECTOMY  11/06/2015   Procedure: POLYPECTOMY;  Surgeon: Lucilla Lame, MD;  Location: [place];  Service: Endoscopy;;    OB History   No obstetric history on file.      Home Medications    Prior to Admission medications   Medication Sig Start Date End Date Taking? Authorizing Provider    albuterol (PROVENTIL HFA;VENTOLIN HFA) 108 (90 Base) MCG/ACT inhaler Inhale 2 puffs into the lungs every 4 (four) hours as needed for wheezing. 05/17/18   Marylene Land, NP  cetirizine (ZYRTEC) 10 MG tablet Take 10 mg by mouth daily.    [provider]  chlorpheniramine-HYDROcodone (TUSSIONEX PENNKINETIC ER) 10-8 MG/5ML SUER Take 5 mLs by mouth at bedtime as needed for cough. do not drive or operate machinery while taking as can cause drowsiness. 05/17/18   Marylene Land, NP  doxycycline (VIBRAMYCIN) 100 MG capsule Take 1 capsule (100 mg total) by mouth 2 (two) times daily. 05/17/18   Marylene Land, NP  nitrofurantoin, macrocrystal-monohydrate, (MACROBID) 100 MG capsule Take 1 capsule (100 mg total) by mouth 2 (two) times daily. 12/10/15   Glean Hess, MD  pantoprazole (PROTONIX) 40 MG tablet Take 1 tablet (40 mg total) by mouth daily. 12/10/15   Glean Hess, MD  Phenazopyridine HCl (URISTAT PO) Take by mouth.    [provider]  predniSONE (DELTASONE) 20 MG tablet Take 2 tablets (40 mg) daily by mouth 05/26/18   Lorin Picket, PA-C    Family History Family History  Problem Relation Age of Onset  . Throat cancer Mother   . CAD Father   . Diabetes Brother     Social History Social History   Tobacco Use  .  Smoking status: Never Smoker  . Smokeless tobacco: Never Used  Substance Use Topics  . Alcohol use: No    Alcohol/week: 0.0 standard drinks  . Drug use: No     Allergies   Patient has no known allergies.   Review of Systems Review of Systems  Constitutional: Positive for chills and fever. Negative for activity change, appetite change, diaphoresis and fatigue.  HENT: Positive for congestion, postnasal drip, rhinorrhea and sore throat. Negative for ear discharge, ear pain and trouble swallowing.   Eyes: Negative for discharge.  Respiratory: Positive for cough and wheezing. Negative for chest tightness and shortness of breath.    Cardiovascular: Negative for chest pain.  Gastrointestinal: Positive for nausea. Negative for abdominal pain, diarrhea and vomiting.  Musculoskeletal: Negative for gait problem and myalgias.  Skin: Negative for rash.  Neurological: Positive for headaches. Negative for dizziness.     Physical Exam Triage Vital Signs ED Triage Vitals  Enc Vitals Group     BP 04/10/20 0929 135/65     Pulse Rate 04/10/20 0929 95     Resp 04/10/20 0929 18     Temp 04/10/20 0929 98.2 F (36.8 C)     Temp Source 04/10/20 0929 Oral     SpO2 04/10/20 0929 99 %     Weight 04/10/20 0928 272 lb (123.4 kg)     Height 04/10/20 0928 5\' 3"  (1.6 m)     Head Circumference --      Peak Flow --      Pain Score 04/10/20 0928 5     Pain Loc --      Pain Edu? --      Excl. in [place]? --    No data found.  Updated Vital Signs BP 135/65   Pulse 95   Temp 98.2 F (36.8 C) (Oral)   Resp 18   Ht 5\' 3"  (1.6 m)   Wt 272 lb (123.4 kg)   SpO2 99%   BMI 48.18 kg/m   Visual Acuity Right Eye Distance:   Left Eye Distance:   Bilateral Distance:    Right Eye Near:   Left Eye Near:    Bilateral Near:     Physical Exam Physical Exam Constitutional:      General: SHe is not in acute distress.    Appearance: He is not toxic-appearing.  HENT:     Head: Normocephalic.     Right Ear: Tympanic membrane, ear canal and external ear normal.     Left Ear: Ear canal and external ear normal.     Nose: Nose normal.     Mouth/Throat:     Mouth: Mucous membranes are moist.     Pharynx: Oropharynx is clear.  Eyes:     General: No scleral icterus.    Conjunctiva/sclera: Conjunctivae normal.  Cardiovascular:     Rate and Rhythm: Normal rate and regular rhythm.     Heart sounds: No murmur heard.   Pulmonary:     Effort: Pulmonary effort is normal. No respiratory distress.     Breath sounds: Has mild Wheezing present.     Comments:  Musculoskeletal:        General: Normal range of motion.     Cervical back: Neck  supple.  Lymphadenopathy:     Cervical: No cervical adenopathy.  Skin:    General: Skin is warm and dry.     Findings: No rash.  Neurological:     Mental Status: SHe is alert and oriented to person,  place, and time.     Gait: Gait normal.  Psychiatric:        Mood and Affect: Mood normal.        Behavior: Behavior normal.        Thought Content: Thought content normal.        Judgment: Judgment normal.     UC Treatments / Results  Labs (all labs ordered are listed, but only abnormal results are displayed) Labs Reviewed  SARS CORONAVIRUS 2 (TAT 6-24 HRS)    EKG   Radiology No results found.  Procedures Procedures (including critical care time)  Medications Ordered in UC Medications - No data to display  Initial Impression / Assessment and Plan / UC Course  I have reviewed the triage vital signs and the nursing notes. Covid test pending. Has wheezing from viral illness I placed refilled the albuterol since hers expired 08/2019 and placed her on prednisone as noted.  MAB option discussed with her.  Final Clinical Impressions(s) / UC Diagnoses   Final diagnoses:  None   Discharge Instructions   None    ED Prescriptions    None     PDMP not reviewed this encounter.   Shelby Mattocks, PA-C 04/10/20 1020

## 2020-04-28 ENCOUNTER — Other Ambulatory Visit: Payer: Self-pay | Admitting: Internal Medicine

## 2020-11-20 ENCOUNTER — Ambulatory Visit
Admission: EM | Admit: 2020-11-20 | Discharge: 2020-11-20 | Disposition: A | Discharge status: Home / Self Care | Payer: Medicare HMO | Attending: Emergency Medicine | Admitting: Emergency Medicine

## 2020-11-20 ENCOUNTER — Other Ambulatory Visit: Payer: Self-pay

## 2020-11-20 DIAGNOSIS — Z20822 Contact with and (suspected) exposure to covid-19: Secondary | ICD-10-CM | POA: Insufficient documentation

## 2020-11-20 DIAGNOSIS — J101 Influenza due to other identified influenza virus with other respiratory manifestations: Secondary | ICD-10-CM

## 2020-11-20 DIAGNOSIS — Z79899 Other long term (current) drug therapy: Secondary | ICD-10-CM | POA: Diagnosis not present

## 2020-11-20 DIAGNOSIS — Z8616 Personal history of COVID-19: Secondary | ICD-10-CM | POA: Insufficient documentation

## 2020-11-20 LAB — RESP PANEL BY RT-PCR (FLU A&B, COVID) ARPGX2
Influenza A by PCR: POSITIVE — AB
Influenza B by PCR: NEGATIVE
SARS Coronavirus 2 by RT PCR: NEGATIVE

## 2020-11-20 MED ORDER — IBUPROFEN 600 MG PO TABS: 600.0000 mg | ORAL_TABLET | Freq: Four times a day (QID) | ORAL | 0 refills | Status: DC | PRN

## 2020-11-20 MED ORDER — XOFLUZA (80 MG DOSE) 2 X 40 MG PO TBPK: 80.0000 mg | ORAL_TABLET | Freq: Once | ORAL | 0 refills | Status: DC

## 2020-11-20 MED ORDER — IBUPROFEN 800 MG PO TABS
800.0000 mg | ORAL_TABLET | Freq: Once | ORAL | Status: AC
  Administered 2020-11-20: 800 mg via ORAL

## 2020-11-20 MED ORDER — ALBUTEROL SULFATE HFA 108 (90 BASE) MCG/ACT IN AERS: 2.0000 | INHALATION_SPRAY | RESPIRATORY_TRACT | 0 refills | Status: DC | PRN

## 2020-11-20 MED ORDER — ACETAMINOPHEN 500 MG PO TABS
1000.0000 mg | ORAL_TABLET | Freq: Once | ORAL | Status: AC
  Administered 2020-11-20: 1000 mg via ORAL

## 2020-11-20 MED ORDER — ALBUTEROL SULFATE HFA 108 (90 BASE) MCG/ACT IN AERS: 1.0000 | INHALATION_SPRAY | RESPIRATORY_TRACT | 0 refills | Status: AC | PRN

## 2020-11-20 MED ORDER — OSELTAMIVIR PHOSPHATE 75 MG PO CAPS: 75.0000 mg | ORAL_CAPSULE | Freq: Two times a day (BID) | ORAL | 0 refills | Status: DC

## 2020-11-20 MED ORDER — AEROCHAMBER PLUS MISC: 2 refills | Status: AC

## 2020-11-20 MED ORDER — BENZONATATE 200 MG PO CAPS: 200.0000 mg | ORAL_CAPSULE | Freq: Three times a day (TID) | ORAL | 0 refills | Status: DC | PRN

## 2020-11-20 NOTE — ED Triage Notes (Signed)
Patient complains of cough, shortness of breath and fever since yesterday. States that she also has chills and all over body aches.

## 2020-11-20 NOTE — ED Provider Notes (Signed)
HPI  SUBJECTIVE:  Stephanie Ferguson is a 69 y.o. female who presents with body aches, headaches, nasal congestion, rhinorrhea, sore throat secondary to cough, nonproductive cough, wheeze, shortness of breath starting yesterday.  Unable to sleep at night secondary to the cough.  No fevers, postnasal drip, loss of sense of smell or taste, chest pain.  No nausea, vomiting, diarrhea, abdominal pain.  No known COVID or flu exposure.  She got the COVID booster and the flu vaccine.  No antibiotics in the past 3 months.  No antipyretic in the past 6 hours.  She states that her home COVID test was negative.  She tried rest, albuterol without improvement in her symptoms.  Symptoms worse with coughing.  She has a past medical history of COVID in 2020 and uses an albuterol inhaler as needed.  BMI is 47.  No history of diabetes, hypertension, pulmonary disease, smoking, chronic kidney disease.  PMD: [facility] internal medicine.   Past Medical History:  Diagnosis Date  . Back pain   . Chronic urinary tract infection     Past Surgical History:  Procedure Laterality Date  . ABDOMINAL HYSTERECTOMY     ovaries remain  . APPENDECTOMY    . CARPAL TUNNEL RELEASE    . CHOLECYSTECTOMY    . COLONOSCOPY WITH PROPOFOL N/A 11/06/2015   Procedure: COLONOSCOPY WITH PROPOFOL;  Surgeon: Lucilla Lame, MD;  Location: [place];  Service: Endoscopy;  Laterality: N/A;  . ESOPHAGOGASTRODUODENOSCOPY (EGD) WITH PROPOFOL N/A 11/06/2015   Procedure: ESOPHAGOGASTRODUODENOSCOPY (EGD) WITH PROPOFOL;  Surgeon: Lucilla Lame, MD;  Location: [place];  Service: Endoscopy;  Laterality: N/A;  . POLYPECTOMY  11/06/2015   Procedure: POLYPECTOMY;  Surgeon: Lucilla Lame, MD;  Location: [place];  Service: Endoscopy;;    Family History  Problem Relation Age of Onset  . Throat cancer Mother   . CAD Father   . Diabetes Brother     Social History   Tobacco Use  . Smoking status: Never Smoker  . Smokeless tobacco:  Never Used  Substance Use Topics  . Alcohol use: No    Alcohol/week: 0.0 standard drinks  . Drug use: No    No current facility-administered medications for this encounter.  Current Outpatient Medications:  .  albuterol (VENTOLIN HFA) 108 (90 Base) MCG/ACT inhaler, Inhale 2 puffs into the lungs every 4 (four) hours as needed for wheezing., Disp: 18 g, Rfl: 0 .  cetirizine (ZYRTEC) 10 MG tablet, Take 10 mg by mouth daily., Disp: , Rfl:  .  chlorpheniramine-HYDROcodone (TUSSIONEX PENNKINETIC ER) 10-8 MG/5ML SUER, Take 5 mLs by mouth at bedtime as needed for cough. do not drive or operate machinery while taking as can cause drowsiness., Disp: 50 mL, Rfl: 0 .  doxycycline (VIBRAMYCIN) 100 MG capsule, Take 1 capsule (100 mg total) by mouth 2 (two) times daily., Disp: 20 capsule, Rfl: 0 .  nitrofurantoin, macrocrystal-monohydrate, (MACROBID) 100 MG capsule, Take 1 capsule (100 mg total) by mouth 2 (two) times daily., Disp: 20 capsule, Rfl: 0 .  pantoprazole (PROTONIX) 40 MG tablet, Take 1 tablet (40 mg total) by mouth daily., Disp: 30 tablet, Rfl: 3 .  Phenazopyridine HCl (URISTAT PO), Take by mouth., Disp: , Rfl:  .  predniSONE (DELTASONE) 20 MG tablet, Take 1 tablet (20 mg total) by mouth daily with breakfast. For wheezing, Disp: 5 tablet, Rfl: 0  No Known Allergies   ROS  As noted in HPI.   Physical Exam  BP 125/76 (BP Location: Left Arm)  Pulse (!) 112   Temp (!) 100.6 F (38.1 C) (Oral)   Resp 18   Ht 5\' 3"  (1.6 m)   Wt 122 kg   SpO2 99%   BMI 47.65 kg/m   Constitutional: Well developed, well nourished, increased work of breathing, but speaking in full sentences easily Eyes:  EOMI, conjunctiva normal bilaterally HENT: Normocephalic, atraumatic,mucus membranes moist.  Positive nasal congestion.  No sinus tenderness.  Positive postnasal drip Respiratory: Normal inspiratory effort, lungs clear bilaterally.  Poor/fair air movement. Cardiovascular: Regular tachycardia, no  murmurs, rubs, gallops. GI: nondistended skin: No rash, skin intact Musculoskeletal: no deformities Neurologic: Alert & oriented x 3, no focal neuro deficits Psychiatric: Speech and behavior appropriate   ED Course   Medications  acetaminophen (TYLENOL) tablet 1,000 mg (1,000 mg Oral Given 11/20/20 1250)  ibuprofen (ADVIL) tablet 800 mg (800 mg Oral Given 11/20/20 1251)    Orders Placed This Encounter  Procedures  . Resp Panel by RT-PCR (Flu A&B, Covid) Nasopharyngeal Swab    Standing Status:   Standing    Number of Occurrences:   1    Order Specific Question:   Is this test for diagnosis or screening    Answer:   Diagnosis of ill patient    Order Specific Question:   Symptomatic for COVID-19 as defined by CDC    Answer:   Yes    Order Specific Question:   Date of Symptom Onset    Answer:   11/19/2020    Order Specific Question:   Hospitalized for COVID-19    Answer:   No    Order Specific Question:   Admitted to ICU for COVID-19    Answer:   No    Order Specific Question:   Previously tested for COVID-19    Answer:   Yes    Order Specific Question:   Resident in a congregate (group) care setting    Answer:   No    Order Specific Question:   Employed in healthcare setting    Answer:   No    Order Specific Question:   Pregnant    Answer:   No    Order Specific Question:   Has patient completed COVID vaccination(s) (2 doses of Pfizer/Moderna 1 dose of [employer])    Answer:   Yes    Order Specific Question:   Has patient completed COVID Booster / 3rd dose    Answer:   Yes  . Airborne and Contact precautions    Standing Status:   Standing    Number of Occurrences:   1    Results for orders placed or performed during the hospital encounter of 11/20/20 (from the past 24 hour(s))  Resp Panel by RT-PCR (Flu A&B, Covid) Nasopharyngeal Swab     Status: Abnormal   Collection Time: 11/20/20 12:38 PM   Specimen: Nasopharyngeal Swab; Nasopharyngeal(NP) swabs in vial  transport medium  Result Value Ref Range   SARS Coronavirus 2 by RT PCR NEGATIVE NEGATIVE   Influenza A by PCR POSITIVE (A) NEGATIVE   Influenza B by PCR NEGATIVE NEGATIVE   No results found.  ED Clinical Impression  1. Influenza A      ED Assessment/Plan  [place] Narcotic database reviewed for this patient, and feel that the risk/benefit ratio today is favorable for proceeding with a prescription for controlled substance.  Patient with influenza A.  She is dyspneic, but she is speaking in full sentences.  She is satting well room air.  Her lungs are clear, thus a chest x-ray was deferred today as it would not change management.  Start Flonase, saline nasal irrigation, regularly scheduled albuterol for the next 4 days and then as needed.  Tylenol/ibuprofen, Nyra Capes.  ER return precautions given.  Patient unable to afford Xofluza.  Prescribing Tamiflu.  Sent in E Rx  Discussed labs, , MDM, treatment plan, and plan for follow-up with patient. Discussed sn/sx that should prompt return to the ED. patient agrees with plan.   Meds ordered this encounter  Medications  . acetaminophen (TYLENOL) tablet 1,000 mg  . ibuprofen (ADVIL) tablet 800 mg      *This clinic note was created using [employer]. Therefore, there may be occasional mistakes despite careful proofreading.  ?    Melynda Ripple, MD 11/20/20 1514

## 2020-11-20 NOTE — Discharge Instructions (Addendum)
Start Flonase, saline nasal irrigation with a [employer]Med sinus rinse and distilled water as often as you want.  2 puffs from your albuterol inhaler every 4 hours for 2 days, then every 6 hours for 2 days, then as needed.  You may back off on the albuterol if you are starting to feel better.  1000 mg of Tylenol combined with 600 mg of ibuprofen 3-4 times a day as needed for fever and pain.  Stephanie Ferguson will treat the influenza.  If you feel only 1 prescription, fill this 1.  Tessalon for the cough

## 2020-12-12 DIAGNOSIS — E785 Hyperlipidemia, unspecified: Secondary | ICD-10-CM | POA: Diagnosis not present

## 2020-12-16 DIAGNOSIS — R110 Nausea: Secondary | ICD-10-CM | POA: Diagnosis not present

## 2020-12-16 DIAGNOSIS — M1990 Unspecified osteoarthritis, unspecified site: Secondary | ICD-10-CM | POA: Diagnosis not present

## 2020-12-16 DIAGNOSIS — M65331 Trigger finger, right middle finger: Secondary | ICD-10-CM | POA: Diagnosis not present

## 2020-12-16 DIAGNOSIS — N393 Stress incontinence (female) (male): Secondary | ICD-10-CM | POA: Diagnosis not present

## 2020-12-16 DIAGNOSIS — Z6841 Body Mass Index (BMI) 40.0 and over, adult: Secondary | ICD-10-CM | POA: Diagnosis not present

## 2020-12-16 DIAGNOSIS — Z780 Asymptomatic menopausal state: Secondary | ICD-10-CM | POA: Diagnosis not present

## 2020-12-16 DIAGNOSIS — E785 Hyperlipidemia, unspecified: Secondary | ICD-10-CM | POA: Diagnosis not present

## 2020-12-16 DIAGNOSIS — K760 Fatty (change of) liver, not elsewhere classified: Secondary | ICD-10-CM | POA: Diagnosis not present

## 2020-12-16 DIAGNOSIS — R635 Abnormal weight gain: Secondary | ICD-10-CM | POA: Diagnosis not present

## 2020-12-16 DIAGNOSIS — G5603 Carpal tunnel syndrome, bilateral upper limbs: Secondary | ICD-10-CM | POA: Diagnosis not present

## 2020-12-16 DIAGNOSIS — F341 Dysthymic disorder: Secondary | ICD-10-CM | POA: Diagnosis not present

## 2020-12-16 DIAGNOSIS — R300 Dysuria: Secondary | ICD-10-CM | POA: Diagnosis not present

## 2020-12-16 DIAGNOSIS — R7303 Prediabetes: Secondary | ICD-10-CM | POA: Diagnosis not present

## 2020-12-23 DIAGNOSIS — R7989 Other specified abnormal findings of blood chemistry: Secondary | ICD-10-CM | POA: Diagnosis not present

## 2020-12-23 DIAGNOSIS — R635 Abnormal weight gain: Secondary | ICD-10-CM | POA: Diagnosis not present

## 2021-01-02 DIAGNOSIS — Z13820 Encounter for screening for osteoporosis: Secondary | ICD-10-CM | POA: Diagnosis not present

## 2021-01-02 DIAGNOSIS — Z780 Asymptomatic menopausal state: Secondary | ICD-10-CM | POA: Diagnosis not present

## 2021-01-02 DIAGNOSIS — M85852 Other specified disorders of bone density and structure, left thigh: Secondary | ICD-10-CM | POA: Diagnosis not present

## 2021-01-02 DIAGNOSIS — M8580 Other specified disorders of bone density and structure, unspecified site: Secondary | ICD-10-CM | POA: Diagnosis not present

## 2021-01-23 DIAGNOSIS — E038 Other specified hypothyroidism: Secondary | ICD-10-CM | POA: Diagnosis not present

## 2021-03-24 DIAGNOSIS — Z0100 Encounter for examination of eyes and vision without abnormal findings: Secondary | ICD-10-CM | POA: Diagnosis not present

## 2021-03-24 DIAGNOSIS — H2513 Age-related nuclear cataract, bilateral: Secondary | ICD-10-CM | POA: Diagnosis not present

## 2021-03-26 DIAGNOSIS — E785 Hyperlipidemia, unspecified: Secondary | ICD-10-CM | POA: Diagnosis not present

## 2021-03-26 DIAGNOSIS — G8929 Other chronic pain: Secondary | ICD-10-CM | POA: Diagnosis not present

## 2021-03-26 DIAGNOSIS — M545 Low back pain, unspecified: Secondary | ICD-10-CM | POA: Diagnosis not present

## 2021-03-26 DIAGNOSIS — K760 Fatty (change of) liver, not elsewhere classified: Secondary | ICD-10-CM | POA: Diagnosis not present

## 2021-03-26 DIAGNOSIS — Z23 Encounter for immunization: Secondary | ICD-10-CM | POA: Diagnosis not present

## 2021-03-26 DIAGNOSIS — F341 Dysthymic disorder: Secondary | ICD-10-CM | POA: Diagnosis not present

## 2021-03-26 DIAGNOSIS — Z6841 Body Mass Index (BMI) 40.0 and over, adult: Secondary | ICD-10-CM | POA: Diagnosis not present

## 2021-03-26 DIAGNOSIS — E0590 Thyrotoxicosis, unspecified without thyrotoxic crisis or storm: Secondary | ICD-10-CM | POA: Diagnosis not present

## 2021-07-07 DIAGNOSIS — Z1231 Encounter for screening mammogram for malignant neoplasm of breast: Secondary | ICD-10-CM | POA: Diagnosis not present

## 2021-07-28 DIAGNOSIS — Z6841 Body Mass Index (BMI) 40.0 and over, adult: Secondary | ICD-10-CM | POA: Diagnosis not present

## 2021-07-28 DIAGNOSIS — E0590 Thyrotoxicosis, unspecified without thyrotoxic crisis or storm: Secondary | ICD-10-CM | POA: Diagnosis not present

## 2021-07-28 DIAGNOSIS — R1013 Epigastric pain: Secondary | ICD-10-CM | POA: Diagnosis not present

## 2021-07-28 DIAGNOSIS — Z23 Encounter for immunization: Secondary | ICD-10-CM | POA: Diagnosis not present

## 2021-07-28 DIAGNOSIS — M545 Low back pain, unspecified: Secondary | ICD-10-CM | POA: Diagnosis not present

## 2021-07-28 DIAGNOSIS — F341 Dysthymic disorder: Secondary | ICD-10-CM | POA: Diagnosis not present

## 2021-07-28 DIAGNOSIS — D126 Benign neoplasm of colon, unspecified: Secondary | ICD-10-CM | POA: Diagnosis not present

## 2021-07-28 DIAGNOSIS — K760 Fatty (change of) liver, not elsewhere classified: Secondary | ICD-10-CM | POA: Diagnosis not present

## 2021-08-25 DIAGNOSIS — M549 Dorsalgia, unspecified: Secondary | ICD-10-CM | POA: Diagnosis not present

## 2021-08-25 DIAGNOSIS — M5135 Other intervertebral disc degeneration, thoracolumbar region: Secondary | ICD-10-CM | POA: Diagnosis not present

## 2021-09-01 ENCOUNTER — Ambulatory Visit: Payer: Medicare HMO | Attending: Internal Medicine

## 2021-09-01 ENCOUNTER — Other Ambulatory Visit: Payer: Self-pay

## 2021-09-01 DIAGNOSIS — M546 Pain in thoracic spine: Secondary | ICD-10-CM | POA: Diagnosis not present

## 2021-09-01 DIAGNOSIS — M549 Dorsalgia, unspecified: Secondary | ICD-10-CM | POA: Diagnosis not present

## 2021-09-01 DIAGNOSIS — R293 Abnormal posture: Secondary | ICD-10-CM | POA: Diagnosis not present

## 2021-09-01 NOTE — Therapy (Signed)
[facility] [address] [place], [place], [address] Phone: [phone]   Fax:  [phone]  Physical Therapy Evaluation  Patient Details  Name: Stephanie Ferguson MRN: [id number] Date of Birth: 1951-06-17 No data recorded  Encounter Date: 09/01/2021   PT End of Session - 09/01/21 1716     Visit Number 1    Number of Visits 12    Date for PT Re-Evaluation 10/12/21    Authorization - Visit Number 10    PT Start Time 4431    PT Stop Time 1650    PT Time Calculation (min) 45 min    Activity Tolerance Patient tolerated treatment well;Patient limited by pain    Behavior During Therapy WFL for tasks assessed/performed             Past Medical History:  Diagnosis Date   Back pain    Chronic urinary tract infection     Past Surgical History:  Procedure Laterality Date   ABDOMINAL HYSTERECTOMY     ovaries remain   APPENDECTOMY     CARPAL TUNNEL RELEASE     CHOLECYSTECTOMY     COLONOSCOPY WITH PROPOFOL N/A 11/06/2015   Procedure: COLONOSCOPY WITH PROPOFOL;  Surgeon: Lucilla Lame, MD;  Location: [place];  Service: Endoscopy;  Laterality: N/A;   ESOPHAGOGASTRODUODENOSCOPY (EGD) WITH PROPOFOL N/A 11/06/2015   Procedure: ESOPHAGOGASTRODUODENOSCOPY (EGD) WITH PROPOFOL;  Surgeon: Lucilla Lame, MD;  Location: [place];  Service: Endoscopy;  Laterality: N/A;   POLYPECTOMY  11/06/2015   Procedure: POLYPECTOMY;  Surgeon: Lucilla Lame, MD;  Location: [place];  Service: Endoscopy;;    There were no vitals filed for this visit.    Subjective Assessment - 09/01/21 1602     Subjective Pt states she began noticing R midback pain about ~6 months ago.  The pain radiates around her R ribcage area.  She noticed it after she went to sit down at the [facility] and she miscalculated the seat and landed with a hard sit all the way down on the floor.  She did not notice onset of symptoms immediatly though after that  incident so she is unsure if this contributed to it.  She feels some lower back pain during the day, but not the mid back pain.  Her midback pain feels worse at the end of the day after she is relaxing and then tries to get up or turn in bed.  She also notes it is worse first thing in the morning.  The pain is "sharp" and takes a few minutes to resolve.  Overall she feels better if she is moving around.  She has tried 2 muscle relaxers and has not noticed an improvement.  She also tried NSAIDs and she did not notice any improvement.  Overall she feels like the pain has been worsening for the past 3 months and especially worse the past few weeks.  Propping up in bed on her back helps with the pain so it's not as painful to turn and get up out of bed.  Turning to the side (rolling) in bed and also standing upright posture aggravates her pain.  She saw her PCP in February and then was referred to PT.  She had an x-ray of her spine which showed arthritis and some degenerative changes.  She rates pain currently as a 0/10 but it increases to 10/10 when she tries to lie on her back or roll in bed.    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  She enjoys walking and reading for hobbies  Walking is hard right now because her legs feel tired.    Limitations Standing;Walking;House hold activities    How long can you sit comfortably? not limited, she uses a lumbar pillow    How long can you walk comfortably? few minutes (because of back pain and hip pain)    Diagnostic tests x-ray (-) for fx, has had DEXA scan (she notes some changes in her hips but nothing that she recalls in her spine)    Patient Stated Goals for the pain to go away; or at least having less pain in her mid back at the end of the day and in the morning.  Also would like to begin a walking program again.    Currently in Pain? Yes    Pain Location Back    Pain Orientation Right    Pain  Descriptors / Indicators Sharp    Pain Radiating Towards R ribcage    Pain Onset More than a month ago    Pain Frequency Intermittent    Aggravating Factors  turning in bed, trying to extend her spine or stand up straight    Pain Relieving Factors sitting still    Effect of Pain on Daily Activities limiting her ability to get in/out of bed in the evening and also in the morning; difficulty with turning to R              OBJECTIVE  Mental Status Patient is oriented to person, place and time.  Recent memory is intact.  Remote memory is intact.  Attention span and concentration are intact.  Expressive speech is intact.  Patient's fund of knowledge is within normal limits for educational level.  OBSERVATION:  Pt wearing a pain patch on R lower thoracic spine/rib angle area; area around patch visually appears excessively red/irritated; removed for skin inspection- no drainage noted.  SENSATION: Pt in tact light touch sensation along R rib angles   MUSCULOSKELETAL: Tremor: None Bulk: Normal Tone: Normal No visible step-off along spinal column  Posture Pt stands with increased thoracic kyphosis; when cued to stand "upright" she reports onset of typical R mid back pain.  Cervical spin/shoulder screen Did not reproduce sx with cervical spine AROM or shoulder AROM today  Gait Pt amb with decreased stride length/speed, no midback pain reported during ambulation   Palpation (+) TTP and muscle dysfunction noted along R thoracolumbar iliospinalis, longisimis, spinalis mm   Strength (out of 5) 5/5 UE shoulder flex, ER/IR, elbow flex/ext bilaterally Pt reports pain with prone spinal extensor activation, 3-/5 strength  *Indicates pain   AROM (degrees) R/L (all movements include overpressure unless otherwise stated) Thoracolumbar extension (30): unable to extend to neutral spine actively and painful in R lower thoracic spine Thoracolumbar lateral flexion: normal with no pain  with OP Thoracic and Lumbar rotation (30 degrees):  R: 15 degrees and painful in R midback L: 20 degrees Hip ROM deferred due to pt noting high levels of pain with rolling from prone to sidelying today    Passive Accessory Intervertebral Motion (PAIVM) Pt denies reproduction of back pain with CPA L1-L5 and T1-9; (+) pain with CPA at T10-L1; (+) pain with UPA T10-12; did not reproduce sx with PA mob of rib angles. Generally hypomobile throughout  Bed Mobility: Pt notes her typical 10/10 pain with transfer from prone to sidelying on R and  also with sit to stand; took 2-3 min to stand upright while leaning on wall then sx fully resolved to 0/10  Objective measurements completed on examination: See above findings.       PT Education - 09/01/21 1715     Education Details spine anatomy/mechanics, discussed checking her skin integrity and not wearing pain patch for extended periods of time and not sleeping on heating pad for the whole night.    Person(s) Educated Patient    Methods Explanation    Comprehension Verbalized understanding              PT Short Term Goals - 09/01/21 1756       PT SHORT TERM GOAL #1   Title Pt will be able to stand with neutral spine posture x 5 min with <5/10 back pain    Baseline unable    Time 3    Period Weeks    Status New    Target Date 09/22/21               PT Long Term Goals - 09/01/21 1756       PT LONG TERM GOAL #1   Title Pt will be able to transfer from sit to supine and supine to sit with <5/10 back pain utilizing apprpriate body mechanics to promote improved bed mobility at home    Baseline currently sleeping propped up    Time 6    Period Weeks    Status New    Target Date 10/12/21      PT LONG TERM GOAL #2   Title Improve thoracolumbar extension strength to 5/5 to promote ability to stand and walk with upright posture x 10 min    Baseline 3-/5    Time 6    Period Weeks    Status New    Target Date 10/12/21      PT  LONG TERM GOAL #3   Title Improve FOTO by >5 points indicating improved ability to perform bed mobility and walking activities without being limited by back pain    Baseline 55/63    Time 6    Period Weeks    Status New                    Plan - 09/01/21 1718     Clinical Impression Statement Pt is a pleasant 71 y/o F who presents to PT with c/o R thoracolumbar pain that radiates along R ribcage/flank.  Impairments include: abnormal posture (unable to stand upright to neutral spine posture); decreased thoracolumbar extension AROM and decreased thoracic rotation AROM; Extension more painful than R rotation; decreased joint mobility in lower thoracic spine; (+) mm tightness along R thoracolumbar paraspinals, and decreased strength in thoracolumbar mm.  She also arrived wearing a topical pain patch and upon observation her skin appeared red/irritated.  PT removed patch and discussed importance of not sleeping on heating pad all night long.  Skin appeared less red by end of session.  Overall, she is very motivated to participate in PT and should do well with tx.    Personal Factors and Comorbidities Age;Time since onset of injury/illness/exacerbation;Comorbidity 1    Examination-Activity Limitations Bed Mobility;Transfers;Sleep;Lift;Reach Overhead    Examination-Participation Restrictions Community Activity;Occupation    Stability/Clinical Decision Making Evolving/Moderate complexity    Clinical Decision Making Moderate    Rehab Potential Good    PT Frequency 2x / week    PT Duration 6 weeks    PT Treatment/Interventions ADLs/Self  Care Home Management;Moist Heat;Functional mobility training;Therapeutic activities;Therapeutic exercise;Neuromuscular re-education;Manual techniques    PT Next Visit Plan thoracic spine ROM, manual therapy, diaphragmatic breathing, check R thoracolumbar skin where increased redness noted under the topical pain patch she was wearing at eval.    Consulted and  Agree with Plan of Care Patient             Patient will benefit from skilled therapeutic intervention in order to improve the following deficits and impairments:  Decreased range of motion, Difficulty walking, Pain, Decreased activity tolerance, Hypomobility, Impaired flexibility, Decreased mobility, Decreased strength, Postural dysfunction  Visit Diagnosis: Pain in thoracic spine  Mid back pain on right side  Abnormal posture     Problem List Patient Active Problem List   Diagnosis Date Noted   Adenomatous colon polyp    Early satiety    Nausea    Gastritis, Helicobacter pylori    Sigmoid diverticulosis 10/03/2015   Fatty liver 10/03/2015   Hyperlipidemia, mild 10/03/2015    Pincus Badder, PT 09/01/2021, 6:00 PM Merdis Delay, PT, DPT  [phone]  [facility] [address] [place], [place], [address] Phone: [phone]   Fax:  [phone]  Name: Stephanie Ferguson MRN: [id number] Date of Birth: 10/13/1950

## 2021-09-03 ENCOUNTER — Ambulatory Visit: Payer: Medicare HMO

## 2021-09-03 ENCOUNTER — Encounter: Payer: Self-pay | Admitting: Physical Therapy

## 2021-09-03 ENCOUNTER — Other Ambulatory Visit: Payer: Self-pay

## 2021-09-03 DIAGNOSIS — R293 Abnormal posture: Secondary | ICD-10-CM | POA: Diagnosis not present

## 2021-09-03 DIAGNOSIS — M546 Pain in thoracic spine: Secondary | ICD-10-CM

## 2021-09-03 DIAGNOSIS — M549 Dorsalgia, unspecified: Secondary | ICD-10-CM

## 2021-09-03 NOTE — Therapy (Signed)
[facility] [address] [place], [place], [address] Phone: [phone]   Fax:  [phone]  Physical Therapy Treatment  Patient Details  Name: Stephanie Ferguson MRN: [id number] Date of Birth: [birth year] No data recorded  Encounter Date: 09/03/2021   PT End of Session - 09/03/21 0720     Visit Number 2    Number of Visits 12    Date for PT Re-Evaluation 10/12/21    Authorization - Visit Number 10    PT Start Time 0725    PT Stop Time 0805    PT Time Calculation (min) 40 min    Activity Tolerance Patient tolerated treatment well;Patient limited by pain    Behavior During Therapy WFL for tasks assessed/performed             Past Medical History:  Diagnosis Date   Back pain    Chronic urinary tract infection     Past Surgical History:  Procedure Laterality Date   ABDOMINAL HYSTERECTOMY     ovaries remain   APPENDECTOMY     CARPAL TUNNEL RELEASE     CHOLECYSTECTOMY     COLONOSCOPY WITH PROPOFOL N/A 11/06/2015   Procedure: COLONOSCOPY WITH PROPOFOL;  Surgeon: Lucilla Lame, MD;  Location: [place];  Service: Endoscopy;  Laterality: N/A;   ESOPHAGOGASTRODUODENOSCOPY (EGD) WITH PROPOFOL N/A 11/06/2015   Procedure: ESOPHAGOGASTRODUODENOSCOPY (EGD) WITH PROPOFOL;  Surgeon: Lucilla Lame, MD;  Location: [place];  Service: Endoscopy;  Laterality: N/A;   POLYPECTOMY  11/06/2015   Procedure: POLYPECTOMY;  Surgeon: Lucilla Lame, MD;  Location: [place];  Service: Endoscopy;;    There were no vitals filed for this visit.   Subjective Assessment - 09/03/21 0728     Subjective Pt reports 5/10 pain in mid back area currently. Reports pain has been slightly improved since eval but getting in/out of bed is still aggravating. Has not inspected skin but does state she has not been wearing a patch on that area.    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  She enjoys walking and reading for hobbies  Walking is hard right now because her legs feel tired.    Limitations Standing;Walking;House hold activities    How long can you sit comfortably? not limited, she uses a lumbar pillow    How long can you walk comfortably? few minutes (because of back pain and hip pain)    Diagnostic tests x-ray (-) for fx, has had DEXA scan (she notes some changes in her hips but nothing that she recalls in her spine)    Patient Stated Goals for the pain to go away; or at least having less pain in her mid back at the end of the day and in the morning.  Also would like to begin a walking program again.    Currently in Pain? Yes    Pain Score 5     Pain Location Back    Pain Orientation Right    Pain Descriptors / Indicators Sharp    Pain Type Acute pain    Pain Onset More than a month ago    Pain Frequency Intermittent            There.ex:   Nu-Step L2 for 5 min with UE/LE use to introduce gentle thoracic rotation AROM. VC's to keep in pain free AROM .  Seated exercise with pillow at lumbar spine for support:  Thoracic extension: x10    Thoracic rotation: x10   Physioball roll outs forwards, R/L deviations: x10/direction with 2-3 sec holds.   Scap retractions: 3x20  HEP provided with home set up modifications. Reps, sets, frequency provided.    Therapeutic Activity:   Education, demo and performance on log roll technique to enter and exit bed to improve pain with bed mobility. Performed x2. Required mod VC's/TC's  for form/technique initially. Great carryover on second repetition. PT endorses significant improvement in pain reduction with exiting bed with this technique.     PT Education - 09/03/21 0719     Education Details form/technique with exercise.    Person(s) Educated Patient    Methods Explanation;Demonstration;Tactile cues;Verbal cues;Handout    Comprehension Verbalized understanding;Returned demonstration               PT Short Term Goals - 09/01/21 1756       PT SHORT TERM GOAL #1   Title Pt will be able to stand with neutral spine posture x 5 min with <5/10 back pain    Baseline unable    Time 3    Period Weeks    Status New    Target Date 09/22/21               PT Long Term Goals - 09/01/21 1756       PT LONG TERM GOAL #1   Title Pt will be able to transfer from sit to supine and supine to sit with <5/10 back pain utilizing apprpriate body mechanics to promote improved bed mobility at home    Baseline currently sleeping propped up    Time 6    Period Weeks    Status New    Target Date 10/12/21      PT LONG TERM GOAL #2   Title Improve thoracolumbar extension strength to 5/5 to promote ability to stand and walk with upright posture x 10 min    Baseline 3-/5    Time 6    Period Weeks    Status New    Target Date 10/12/21      PT LONG TERM GOAL #3   Title Improve FOTO by >5 points indicating improved ability to perform bed mobility and walking activities without being limited by back pain    Baseline 55/63    Time 6    Period Weeks    Status New                   Plan - 09/03/21 6767     Clinical Impression Statement Focus of session on establishing HEP. Pt tolerating gentle thoracolumbar mobility in most planes without aggravation of symptoms. Pt education on log roll technique with improvement in back pain after performance compared to previous methods pt has been performing. HEP established and provided with pt demo'ing good understanding. Pt has no pain post session. Pt will continue to benefit from skilled PT services to further progress pain free mobility with ADL tasks.    Personal Factors and Comorbidities Age;Time since onset of injury/illness/exacerbation;Comorbidity 1    Examination-Activity Limitations Bed Mobility;Transfers;Sleep;Lift;Reach Overhead    Examination-Participation Restrictions Community Activity;Occupation    Stability/Clinical Decision Making  Evolving/Moderate complexity    Rehab Potential Good    PT Frequency 2x / week    PT Duration 6 weeks    PT Treatment/Interventions ADLs/Self Care Home Management;Moist Heat;Functional mobility training;Therapeutic activities;Therapeutic exercise;Neuromuscular re-education;Manual techniques    PT Next Visit Plan thoracic spine ROM, manual therapy,  diaphragmatic breathing, check R thoracolumbar skin where increased redness noted under the topical pain patch she was wearing at eval.    PT Home Exercise Plan Access Code: [email or web address]    Consulted and Agree with Plan of Care Patient             Patient will benefit from skilled therapeutic intervention in order to improve the following deficits and impairments:  Decreased range of motion, Difficulty walking, Pain, Decreased activity tolerance, Hypomobility, Impaired flexibility, Decreased mobility, Decreased strength, Postural dysfunction  Visit Diagnosis: Pain in thoracic spine  Mid back pain on right side  Abnormal posture     Problem List Patient Active Problem List   Diagnosis Date Noted   Adenomatous colon polyp    Early satiety    Nausea    Gastritis, Helicobacter pylori    Sigmoid diverticulosis 10/03/2015   Fatty liver 10/03/2015   Hyperlipidemia, mild 10/03/2015    Salem Caster. Fairly IV, PT, DPT Physical Therapist- [facility]  09/03/2021, 8:07 AM  [place] [facility] [address] [place], [place], [address] Phone: [phone]   Fax:  [phone]  Name: Jaziya Obarr MRN: [id number] Date of Birth: 06-Jan-1951

## 2021-09-08 ENCOUNTER — Encounter: Payer: Self-pay | Admitting: Physical Therapy

## 2021-09-08 ENCOUNTER — Ambulatory Visit: Payer: Medicare HMO | Admitting: Physical Therapy

## 2021-09-08 ENCOUNTER — Other Ambulatory Visit: Payer: Self-pay

## 2021-09-08 DIAGNOSIS — M546 Pain in thoracic spine: Secondary | ICD-10-CM

## 2021-09-08 DIAGNOSIS — R293 Abnormal posture: Secondary | ICD-10-CM

## 2021-09-08 DIAGNOSIS — M549 Dorsalgia, unspecified: Secondary | ICD-10-CM

## 2021-09-08 NOTE — Therapy (Signed)
[place] [facility] [address] [place], [place], [address] Phone: [phone]   Fax:  [phone]  Physical Therapy Treatment  Patient Details  Name: Stephanie Ferguson MRN: [id number] Date of Birth: 18-Feb-1951 No data recorded  Encounter Date: 09/08/2021   PT End of Session - 09/08/21 1637     Visit Number 3    Number of Visits 12    Date for PT Re-Evaluation 10/12/21    Authorization - Visit Number 10    PT Start Time 1751    PT Stop Time 1648    PT Time Calculation (min) 43 min    Activity Tolerance Patient tolerated treatment well;Patient limited by pain    Behavior During Therapy WFL for tasks assessed/performed             Past Medical History:  Diagnosis Date   Back pain    Chronic urinary tract infection     Past Surgical History:  Procedure Laterality Date   ABDOMINAL HYSTERECTOMY     ovaries remain   APPENDECTOMY     CARPAL TUNNEL RELEASE     CHOLECYSTECTOMY     COLONOSCOPY WITH PROPOFOL N/A 11/06/2015   Procedure: COLONOSCOPY WITH PROPOFOL;  Surgeon: Lucilla Lame, MD;  Location: [place];  Service: Endoscopy;  Laterality: N/A;   ESOPHAGOGASTRODUODENOSCOPY (EGD) WITH PROPOFOL N/A 11/06/2015   Procedure: ESOPHAGOGASTRODUODENOSCOPY (EGD) WITH PROPOFOL;  Surgeon: Lucilla Lame, MD;  Location: [place];  Service: Endoscopy;  Laterality: N/A;   POLYPECTOMY  11/06/2015   Procedure: POLYPECTOMY;  Surgeon: Lucilla Lame, MD;  Location: [place];  Service: Endoscopy;;    There were no vitals filed for this visit.   Subjective Assessment - 09/08/21 1608     Subjective Patient reports 0/10 pain at arrival to PT, but it is worse once she is lying down or "trying to relax" at the end of the day. Patient reports pain along R lower thoracic/upper lumbar region. Pt reports more benefit from using ice versus heat. Good progress to date. Pt is compliant with new HEP.    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  She enjoys walking and reading for hobbies  Walking is hard right now because her legs feel tired.    Limitations Standing;Walking;House hold activities    How long can you sit comfortably? not limited, she uses a lumbar pillow    How long can you walk comfortably? few minutes (because of back pain and hip pain)    Diagnostic tests x-ray (-) for fx, has had DEXA scan (she notes some changes in her hips but nothing that she recalls in her spine)    Patient Stated Goals for the pain to go away; or at least having less pain in her mid back at the end of the day and in the morning.  Also would like to begin a walking program again.    Pain Onset More than a month ago              TREATMENT  Manual Therapy - for symptom modulation, soft tissue sensitivity and mobility, joint mobility, ROM   STM/DTM R T10-L2 paraspinals CPA at T10-L2, gr I-II for pain control, gr III for thoracolumbar mobility    Therapeutic Exercise - for improved soft tissue flexibility and extensibility as needed for ROM, graded thoracolumbar movement to decrease threat to truncal AROM  -Open book attempted, stopped due to  intermittent concordant pain -Bow and arrow, sidelying thoracic rotation; x10 on each side  -Thoracic extension over back of chair: x10  -Physioball roll outs forwards, R/L deviations: 2x5/direction with 2-3 sec holds.  Pt edu: discussed frequent change of sitting position at work and postural correction, continued HEP as established    *not today* -Scap retractions: 3x20 -Thoracic rotation: x10 Nu-Step L2 for 5 min with UE/LE use to introduce gentle thoracic rotation AROM. VC's to keep in pain free AROM .    ASSESSMENT Patient fortunately has made good early progress with PT. She has minimal symptoms at arrival, but notable sharp/"grabbing" symptoms are reproduced with rolling, bridging, and transferring (supine to/from sit,  sit to stand). Patient tolerates supine to sit much better with technique utilized last visit - this was reviewed today. Pt has mild localized sensitivity along R T1-L2 paraspinal musculature, but she tolerates manual therapy well today. Pt has intermittent difficulty with thoracic rotations in lying, but this is improved with modification (shorter upper limb lever arm). Patient will benefit from continued skilled therapeutic intervention to address remaining pain and thoracolumbar mobility deficits for improved function and QoL.      PT Short Term Goals - 09/01/21 1756       PT SHORT TERM GOAL #1   Title Pt will be able to stand with neutral spine posture x 5 min with <5/10 back pain    Baseline unable    Time 3    Period Weeks    Status New    Target Date 09/22/21               PT Long Term Goals - 09/01/21 1756       PT LONG TERM GOAL #1   Title Pt will be able to transfer from sit to supine and supine to sit with <5/10 back pain utilizing apprpriate body mechanics to promote improved bed mobility at home    Baseline currently sleeping propped up    Time 6    Period Weeks    Status New    Target Date 10/12/21      PT LONG TERM GOAL #2   Title Improve thoracolumbar extension strength to 5/5 to promote ability to stand and walk with upright posture x 10 min    Baseline 3-/5    Time 6    Period Weeks    Status New    Target Date 10/12/21      PT LONG TERM GOAL #3   Title Improve FOTO by >5 points indicating improved ability to perform bed mobility and walking activities without being limited by back pain    Baseline 55/63    Time 6    Period Weeks    Status New                   Plan - 09/08/21 2118     Clinical Impression Statement Patient fortunately has made good early progress with PT. She has minimal symptoms at arrival, but notable sharp/"grabbing" symptoms are reproduced with rolling, bridging, and transferring (supine to/from sit, sit to stand).  Patient tolerates supine to sit much better with technique utilized last visit - this was reviewed today. Pt has mild localized sensitivity along R T1-L2 paraspinal musculature, but she tolerates manual therapy well today. Pt has intermittent difficulty with thoracic rotations in lying, but this is improved with modification (shorter upper limb lever arm). Patient will benefit from continued skilled therapeutic intervention to address remaining pain and thoracolumbar  mobility deficits for improved function and QoL.    Personal Factors and Comorbidities Age;Time since onset of injury/illness/exacerbation;Comorbidity 1    Examination-Activity Limitations Bed Mobility;Transfers;Sleep;Lift;Reach Overhead    Examination-Participation Restrictions Community Activity;Occupation    Stability/Clinical Decision Making Evolving/Moderate complexity    Rehab Potential Good    PT Frequency 2x / week    PT Duration 6 weeks    PT Treatment/Interventions ADLs/Self Care Home Management;Moist Heat;Functional mobility training;Therapeutic activities;Therapeutic exercise;Neuromuscular re-education;Manual techniques    PT Next Visit Plan thoracic spine ROM, manual therapy, diaphragmatic breathing, check R thoracolumbar skin where increased redness noted under the topical pain patch she was wearing at eval.    PT Home Exercise Plan Access Code: [email or web address]    Consulted and Agree with Plan of Care Patient             Patient will benefit from skilled therapeutic intervention in order to improve the following deficits and impairments:  Decreased range of motion, Difficulty walking, Pain, Decreased activity tolerance, Hypomobility, Impaired flexibility, Decreased mobility, Decreased strength, Postural dysfunction  Visit Diagnosis: Pain in thoracic spine  Mid back pain on right side  Abnormal posture     Problem List Patient Active Problem List   Diagnosis Date Noted   Adenomatous colon polyp    Early satiety     Nausea    Gastritis, Helicobacter pylori    Sigmoid diverticulosis 10/03/2015   Fatty liver 10/03/2015   Hyperlipidemia, mild 10/03/2015   Valentina Gu, PT, DPT #[id number]  Eilleen Kempf, PT 09/08/2021, 9:18 PM  [facility] [address] [place], [place], [address] Phone: [phone]   Fax:  [phone]  Name: Stephanie Ferguson MRN: [id number] Date of Birth: 24-Apr-1951

## 2021-09-10 ENCOUNTER — Other Ambulatory Visit: Payer: Self-pay

## 2021-09-10 ENCOUNTER — Ambulatory Visit: Payer: Medicare HMO | Attending: Internal Medicine | Admitting: Physical Therapy

## 2021-09-10 DIAGNOSIS — M546 Pain in thoracic spine: Secondary | ICD-10-CM | POA: Insufficient documentation

## 2021-09-10 DIAGNOSIS — M549 Dorsalgia, unspecified: Secondary | ICD-10-CM | POA: Diagnosis not present

## 2021-09-10 DIAGNOSIS — R293 Abnormal posture: Secondary | ICD-10-CM | POA: Diagnosis not present

## 2021-09-10 NOTE — Therapy (Signed)
[place] [facility] [address] [place], [place], [address] Phone: [phone]   Fax:  [phone]  Physical Therapy Treatment  Patient Details  Name: Kadeja Granada MRN: [id number] Date of Birth: 1951/04/07 No data recorded  Encounter Date: 09/10/2021   PT End of Session - 09/14/21 0901     Visit Number 4    Number of Visits 12    Date for PT Re-Evaluation 10/12/21    Authorization - Visit Number 10    PT Start Time 7588    PT Stop Time 0941    PT Time Calculation (min) 46 min    Activity Tolerance Patient tolerated treatment well;Patient limited by pain    Behavior During Therapy WFL for tasks assessed/performed             Past Medical History:  Diagnosis Date   Back pain    Chronic urinary tract infection     Past Surgical History:  Procedure Laterality Date   ABDOMINAL HYSTERECTOMY     ovaries remain   APPENDECTOMY     CARPAL TUNNEL RELEASE     CHOLECYSTECTOMY     COLONOSCOPY WITH PROPOFOL N/A 11/06/2015   Procedure: COLONOSCOPY WITH PROPOFOL;  Surgeon: Lucilla Lame, MD;  Location: [place];  Service: Endoscopy;  Laterality: N/A;   ESOPHAGOGASTRODUODENOSCOPY (EGD) WITH PROPOFOL N/A 11/06/2015   Procedure: ESOPHAGOGASTRODUODENOSCOPY (EGD) WITH PROPOFOL;  Surgeon: Lucilla Lame, MD;  Location: [place];  Service: Endoscopy;  Laterality: N/A;   POLYPECTOMY  11/06/2015   Procedure: POLYPECTOMY;  Surgeon: Lucilla Lame, MD;  Location: [place];  Service: Endoscopy;;    There were no vitals filed for this visit.   Subjective Assessment - 09/14/21 0900     Subjective Patient reports tolerating last session well. She maintained her HEP and was able to perform bow-and-arrow drill at home for thoracic mobility. She reports doing better with bed mobility utilizing technique described by her previous PT. She denies significant pain at arrival to PT, though R lower thoracic/upper lumbar region does  feel sore.    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  She enjoys walking and reading for hobbies  Walking is hard right now because her legs feel tired.    Limitations Standing;Walking;House hold activities    How long can you sit comfortably? not limited, she uses a lumbar pillow    How long can you walk comfortably? few minutes (because of back pain and hip pain)    Diagnostic tests x-ray (-) for fx, has had DEXA scan (she notes some changes in her hips but nothing that she recalls in her spine)    Patient Stated Goals for the pain to go away; or at least having less pain in her mid back at the end of the day and in the morning.  Also would like to begin a walking program again.    Pain Onset More than a month ago                 TREATMENT   Manual Therapy - for symptom modulation, soft tissue sensitivity and mobility, joint mobility, ROM    STM/DTM and IASTM wit Hypervolt R T10-L2 paraspinals CPA at T10-L2, gr I-II for pain control, gr III for thoracolumbar mobility      Therapeutic Exercise - for improved soft tissue flexibility and extensibility as needed for ROM, graded thoracolumbar movement to decrease threat to  truncal AROM    -Bow and arrow, sidelying thoracic rotation; x10 on each side with moderate cueing for thoracic rotation versus pure shoulder HABD -Seated row with upright posture, Green Tband; 2x10 -Seated bilateral ER with chest lift; Green Tband; 2x10 -Thoracic extension with foam roll at wall (serratus slide technique); 2x10, 3 sec hold at top -Lower trunk rotation with QL bias; x10 ea side, 5 sec hold     *not today* -Thoracic extension over back of chair: x10  -Physioball roll outs forwards, R/L deviations: 2x5/direction with 2-3 sec holds. -Scap retractions: 3x20 -Thoracic rotation: x10 Nu-Step L2 for 5 min with UE/LE use to introduce gentle thoracic rotation AROM. VC's to keep in pain free  AROM .       ASSESSMENT Patient demonstrates fair technique with bow and arrow exercise (modification to open-book with decreased shoulder horizontal abduction). She requires moderate cueing for utilizing gravity to move into thoracic rotation from sidelying. She has ongoing sensitivity affecting R longissimus thoracis/longissimus lumborum around thoracolumbar junction region; pt tolerates manual therapy well today with good response to IASTM. She has remaining tightness with lateral flexion to L, but otherwise she demonstrates minimal AROM deficits at this time. Patient will benefit from continued skilled therapeutic intervention to address remaining pain and thoracolumbar mobility deficits for improved function and QoL.        PT Short Term Goals - 09/01/21 1756       PT SHORT TERM GOAL #1   Title Pt will be able to stand with neutral spine posture x 5 min with <5/10 back pain    Baseline unable    Time 3    Period Weeks    Status New    Target Date 09/22/21               PT Long Term Goals - 09/01/21 1756       PT LONG TERM GOAL #1   Title Pt will be able to transfer from sit to supine and supine to sit with <5/10 back pain utilizing apprpriate body mechanics to promote improved bed mobility at home    Baseline currently sleeping propped up    Time 6    Period Weeks    Status New    Target Date 10/12/21      PT LONG TERM GOAL #2   Title Improve thoracolumbar extension strength to 5/5 to promote ability to stand and walk with upright posture x 10 min    Baseline 3-/5    Time 6    Period Weeks    Status New    Target Date 10/12/21      PT LONG TERM GOAL #3   Title Improve FOTO by >5 points indicating improved ability to perform bed mobility and walking activities without being limited by back pain    Baseline 55/63    Time 6    Period Weeks    Status New                   Plan - 09/14/21 7062     Clinical Impression Statement Patient demonstrates  fair technique with bow and arrow exercise (modification to open-book with decreased shoulder horizontal abduction). She requires moderate cueing for utilizing gravity to move into thoracic rotation from sidelying. She has ongoing sensitivity affecting R longissimus thoracis/longissimus lumborum around thoracolumbar junction region; pt tolerates manual therapy well today with good response to IASTM. She has remaining tightness with lateral flexion to L, but otherwise she demonstrates  minimal AROM deficits at this time. Patient will benefit from continued skilled therapeutic intervention to address remaining pain and thoracolumbar mobility deficits for improved function and QoL.    Personal Factors and Comorbidities Age;Time since onset of injury/illness/exacerbation;Comorbidity 1    Examination-Activity Limitations Bed Mobility;Transfers;Sleep;Lift;Reach Overhead    Examination-Participation Restrictions Community Activity;Occupation    Stability/Clinical Decision Making Evolving/Moderate complexity    Rehab Potential Good    PT Frequency 2x / week    PT Duration 6 weeks    PT Treatment/Interventions ADLs/Self Care Home Management;Moist Heat;Functional mobility training;Therapeutic activities;Therapeutic exercise;Neuromuscular re-education;Manual techniques    PT Next Visit Plan thoracic spine ROM, manual therapy, diaphragmatic breathing; progress with graded loading and recovery of function as tolerated.    PT Home Exercise Plan Access Code: [email or web address]    Consulted and Agree with Plan of Care Patient             Patient will benefit from skilled therapeutic intervention in order to improve the following deficits and impairments:  Decreased range of motion, Difficulty walking, Pain, Decreased activity tolerance, Hypomobility, Impaired flexibility, Decreased mobility, Decreased strength, Postural dysfunction  Visit Diagnosis: Pain in thoracic spine  Mid back pain on right side  Abnormal  posture     Problem List Patient Active Problem List   Diagnosis Date Noted   Adenomatous colon polyp    Early satiety    Nausea    Gastritis, Helicobacter pylori    Sigmoid diverticulosis 10/03/2015   Fatty liver 10/03/2015   Hyperlipidemia, mild 10/03/2015   Valentina Gu, PT, DPT #[id number]  Eilleen Kempf, PT 09/14/2021, 9:04 AM  [place] [facility] [address] [place], [place], [address] Phone: [phone]   Fax:  [phone]  Name: Madi Bonfiglio MRN: [id number] Date of Birth: 17-Jul-1950

## 2021-09-14 ENCOUNTER — Encounter: Payer: Self-pay | Admitting: Physical Therapy

## 2021-09-15 ENCOUNTER — Ambulatory Visit: Payer: Medicare HMO | Admitting: Physical Therapy

## 2021-09-17 ENCOUNTER — Other Ambulatory Visit: Payer: Self-pay

## 2021-09-17 ENCOUNTER — Encounter: Payer: Self-pay | Admitting: Physical Therapy

## 2021-09-17 ENCOUNTER — Ambulatory Visit: Payer: Medicare HMO | Admitting: Physical Therapy

## 2021-09-17 DIAGNOSIS — M549 Dorsalgia, unspecified: Secondary | ICD-10-CM | POA: Diagnosis not present

## 2021-09-17 DIAGNOSIS — M546 Pain in thoracic spine: Secondary | ICD-10-CM

## 2021-09-17 DIAGNOSIS — R293 Abnormal posture: Secondary | ICD-10-CM | POA: Diagnosis not present

## 2021-09-17 NOTE — Therapy (Signed)
[facility] [address] [place], [place], [address] Phone: [phone]   Fax:  [phone]  Physical Therapy Treatment  Patient Details  Name: Stephanie Ferguson MRN: [id number] Date of Birth: Apr 15, 1951 No data recorded  Encounter Date: 09/17/2021   PT End of Session - 09/17/21 0731     Visit Number 5    Number of Visits 12    Date for PT Re-Evaluation 10/12/21    Authorization - Visit Number 10    PT Start Time 0726    PT Stop Time 0810    PT Time Calculation (min) 44 min    Activity Tolerance Patient tolerated treatment well;Patient limited by pain    Behavior During Therapy WFL for tasks assessed/performed             Past Medical History:  Diagnosis Date   Back pain    Chronic urinary tract infection     Past Surgical History:  Procedure Laterality Date   ABDOMINAL HYSTERECTOMY     ovaries remain   APPENDECTOMY     CARPAL TUNNEL RELEASE     CHOLECYSTECTOMY     COLONOSCOPY WITH PROPOFOL N/A 11/06/2015   Procedure: COLONOSCOPY WITH PROPOFOL;  Surgeon: Lucilla Lame, MD;  Location: [place];  Service: Endoscopy;  Laterality: N/A;   ESOPHAGOGASTRODUODENOSCOPY (EGD) WITH PROPOFOL N/A 11/06/2015   Procedure: ESOPHAGOGASTRODUODENOSCOPY (EGD) WITH PROPOFOL;  Surgeon: Lucilla Lame, MD;  Location: [place];  Service: Endoscopy;  Laterality: N/A;   POLYPECTOMY  11/06/2015   Procedure: POLYPECTOMY;  Surgeon: Lucilla Lame, MD;  Location: [place];  Service: Endoscopy;;    There were no vitals filed for this visit.   Subjective Assessment - 09/17/21 0727     Subjective Patient reports episode of vertigo earlier this week - she missed PT due to this. She reports severe episode months ago and moderate episode this past Tuesday. Patient denies vertigo the last 2 days. Pt has not followed up formally with physician regarding vertigo. Patient reports feeling fairly well at arrival to PT - she reports R  flank feels like "light bruise." Patient reports pain is moving more toward anterior rib cage versus hurting in her back. Patient reports missing HEP during vertigo episode. Patient reports 1/10 pain at arrival.    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  She enjoys walking and reading for hobbies  Walking is hard right now because her legs feel tired.    Limitations Standing;Walking;House hold activities    How long can you sit comfortably? not limited, she uses a lumbar pillow    How long can you walk comfortably? few minutes (because of back pain and hip pain)    Diagnostic tests x-ray (-) for fx, has had DEXA scan (she notes some changes in her hips but nothing that she recalls in her spine)    Patient Stated Goals for the pain to go away; or at least having less pain in her mid back at the end of the day and in the morning.  Also would like to begin a walking program again.    Currently in Pain? Yes    Pain Score 1     Pain Onset More than a month ago               OBJECTIVE FINDINGS  AROM Lumbar flexion 100% Lumbar extension 100% Lateral flexion: R 100%, L 50%* Thoracolumbar rotation:  R 100%, L 100%      TREATMENT   Manual Therapy - for symptom modulation, soft tissue sensitivity and mobility, joint mobility, ROM    STM/DTM and IASTM with Hypervolt R T10-L2 paraspinals CPA at T10-L2, gr I-II for pain control, gr III for thoracolumbar mobility  UPA R at T10-L2 gr III for increasing joint mobility      Therapeutic Exercise - for improved soft tissue flexibility and extensibility as needed for ROM, graded thoracolumbar movement to decrease threat to truncal AROM   -Review of trunk AROM baselines -Thoracic extension over back of chair: x10  -Lower trunk rotation with QL bias; x10 ea side, 5 sec hold  -Seated bilateral ER with chest lift; Green Tband; 2x10, with tactile and verbal cueing for scapular  depression -Standing bilateral row with upright posture, with Nautilus; 2x10; tactile and verbal cueing for scapular depression/retraction      *not today* -Thoracic extension with foam roll at wall (serratus slide technique); 2x10, 3 sec hold at top [employer] and arrow, sidelying thoracic rotation; x10 on each side with moderate cueing for thoracic rotation versus pure shoulder HABD -Physioball roll outs forwards, R/L deviations: 2x5/direction with 2-3 sec holds. -Scap retractions: 3x20 -Thoracic rotation: x10 Nu-Step L2 for 5 min with UE/LE use to introduce gentle thoracic rotation AROM. VC's to keep in pain free AROM .         ASSESSMENT Patient has normal AROM overall without symptom provocation with all motions with exception of L lateral flexion (pain and moderate motion loss). She has thoracic and thoracolumbar junction stiffness and R thoracolumbar parsapinal tightness contributing to decreased mobility. Pt fortunately is tolerating transfers (supine to sit, rolling, sit to stand) much better than early in this episode of care. She has significantly lower NPRS at this time. Pt has remaining deficits in trunk extensor weakness, thoracolumbar AROM and accessory mobility deficits, and remaining R-sided pain along thoracolumbar junction with referral around R rib cage. Patient will benefit from continued skilled therapeutic intervention to address remaining pain and thoracolumbar mobility deficits for improved function and QoL.       PT Short Term Goals - 09/01/21 1756       PT SHORT TERM GOAL #1   Title Pt will be able to stand with neutral spine posture x 5 min with <5/10 back pain    Baseline unable    Time 3    Period Weeks    Status New    Target Date 09/22/21               PT Long Term Goals - 09/01/21 1756       PT LONG TERM GOAL #1   Title Pt will be able to transfer from sit to supine and supine to sit with <5/10 back pain utilizing apprpriate body mechanics to  promote improved bed mobility at home    Baseline currently sleeping propped up    Time 6    Period Weeks    Status New    Target Date 10/12/21      PT LONG TERM GOAL #2   Title Improve thoracolumbar extension strength to 5/5 to promote ability to stand and walk with upright posture x 10 min    Baseline 3-/5    Time 6    Period Weeks    Status New    Target Date 10/12/21      PT LONG TERM GOAL #3   Title Improve FOTO by >5 points indicating improved ability  to perform bed mobility and walking activities without being limited by back pain    Baseline 55/63    Time 6    Period Weeks    Status New                   Plan - 09/17/21 2703     Clinical Impression Statement Patient has normal AROM overall without symptom provocation with all motions with exception of L lateral flexion (pain and moderate motion loss). She has thoracic and thoracolumbar junction stiffness and R thoracolumbar parsapinal tightness contributing to decreased mobility. Pt fortunately is tolerating transfers (supine to sit, rolling, sit to stand) much better than early in this episode of care. She has significantly lower NPRS at this time. Pt has remaining deficits in trunk extensor weakness, thoracolumbar AROM and accessory mobility deficits, and remaining R-sided pain along thoracolumbar junction with referral around R rib cage. Patient will benefit from continued skilled therapeutic intervention to address remaining pain and thoracolumbar mobility deficits for improved function and QoL.    Personal Factors and Comorbidities Age;Time since onset of injury/illness/exacerbation;Comorbidity 1    Examination-Activity Limitations Bed Mobility;Transfers;Sleep;Lift;Reach Overhead    Examination-Participation Restrictions Community Activity;Occupation    Stability/Clinical Decision Making Evolving/Moderate complexity    Rehab Potential Good    PT Frequency 2x / week    PT Duration 6 weeks    PT  Treatment/Interventions ADLs/Self Care Home Management;Moist Heat;Functional mobility training;Therapeutic activities;Therapeutic exercise;Neuromuscular re-education;Manual techniques    PT Next Visit Plan thoracic spine ROM, manual therapy, diaphragmatic breathing; progress with graded loading and recovery of function as tolerated.    PT Home Exercise Plan Access Code: [email or web address]    Consulted and Agree with Plan of Care Patient             Patient will benefit from skilled therapeutic intervention in order to improve the following deficits and impairments:  Decreased range of motion, Difficulty walking, Pain, Decreased activity tolerance, Hypomobility, Impaired flexibility, Decreased mobility, Decreased strength, Postural dysfunction  Visit Diagnosis: Pain in thoracic spine  Mid back pain on right side  Abnormal posture     Problem List Patient Active Problem List   Diagnosis Date Noted   Adenomatous colon polyp    Early satiety    Nausea    Gastritis, Helicobacter pylori    Sigmoid diverticulosis 10/03/2015   Fatty liver 10/03/2015   Hyperlipidemia, mild 10/03/2015   Valentina Gu, PT, DPT #[id number]  Eilleen Kempf, PT 09/17/2021, 9:17 AM  [facility] [address] [place], [place], [address] Phone: [phone]   Fax:  [phone]  Name: Stephanie Ferguson MRN: [id number] Date of Birth: 03/20/1951

## 2021-09-22 ENCOUNTER — Ambulatory Visit: Payer: Medicare HMO | Admitting: Physical Therapy

## 2021-09-22 NOTE — Patient Instructions (Incomplete)
?  ?  OBJECTIVE FINDINGS ?  ?AROM ?Lumbar flexion 100% ?Lumbar extension 100% ?Lateral flexion: R 100%, L 50%* ?Thoracolumbar rotation: R 100%, L 100% ?  ?  ?  ?  ?TREATMENT ?  ?Manual Therapy - for symptom modulation, soft tissue sensitivity and mobility, joint mobility, ROM  ?  ?STM/DTM and IASTM with Hypervolt R T10-L2 paraspinals ?CPA at T10-L2, gr I-II for pain control, gr III for thoracolumbar mobility  ?UPA R at T10-L2 gr III for increasing joint mobility  ?  ?  ?Therapeutic Exercise - for improved soft tissue flexibility and extensibility as needed for ROM, graded thoracolumbar movement to decrease threat to truncal AROM ?  ?-Review of trunk AROM baselines ?-Thoracic extension over back of chair: x10  ?-Lower trunk rotation with QL bias; x10 ea side, 5 sec hold  ?-Seated bilateral ER with chest lift; Green Tband; 2x10, with tactile and verbal cueing for scapular depression ?-Standing bilateral row with upright posture, with Nautilus; 2x10, 30-lbs; tactile and verbal cueing for scapular depression/retraction ?-Bridge, hooklying; 2x10 ?  ?  ?*not today* ?-Thoracic extension with foam roll at wall (serratus slide technique); 2x10, 3 sec hold at top ?-Bow and arrow, sidelying thoracic rotation; x10 on each side with moderate cueing for thoracic rotation versus pure shoulder HABD ?-Physioball roll outs forwards, R/L deviations: 2x5/direction with 2-3 sec holds. ?-Scap retractions: 3x20 ?-Thoracic rotation: x10 ?Nu-Step L2 for 5 min with UE/LE use to introduce gentle thoracic rotation AROM. VC's to keep in pain free AROM . ?  ?  ?  ?  ?ASSESSMENT ?Patient has normal AROM overall without symptom provocation with all motions with exception of L lateral flexion (pain and moderate motion loss). She has thoracic and thoracolumbar junction stiffness and R thoracolumbar parsapinal tightness contributing to decreased mobility. Pt fortunately is tolerating transfers (supine to sit, rolling, sit to stand) much better than  early in this episode of care. She has significantly lower NPRS at this time. Pt has remaining deficits in trunk extensor weakness, thoracolumbar AROM and accessory mobility deficits, and remaining R-sided pain along thoracolumbar junction with referral around R rib cage. Patient will benefit from continued skilled therapeutic intervention to address remaining pain and thoracolumbar mobility deficits for improved function and QoL.  ?  ?  ?  ?

## 2021-09-24 ENCOUNTER — Other Ambulatory Visit: Payer: Self-pay

## 2021-09-24 ENCOUNTER — Ambulatory Visit: Payer: Medicare HMO | Admitting: Physical Therapy

## 2021-09-24 DIAGNOSIS — M549 Dorsalgia, unspecified: Secondary | ICD-10-CM | POA: Diagnosis not present

## 2021-09-24 DIAGNOSIS — M546 Pain in thoracic spine: Secondary | ICD-10-CM | POA: Diagnosis not present

## 2021-09-24 DIAGNOSIS — R293 Abnormal posture: Secondary | ICD-10-CM | POA: Diagnosis not present

## 2021-09-24 NOTE — Therapy (Signed)
[place] ?[facility] ?[address]. [place], [place], [address] ?Phone: [phone]   Fax:  [phone] ? ?Physical Therapy Treatment ? ?Patient Details  ?Name: Stephanie Ferguson ?MRN: [id number] ?Date of Birth: 03-Mar-1951 ?No data recorded ? ?Encounter Date: 09/24/2021 ? ? PT End of Session - 09/24/21 0754   ? ? Visit Number 6   ? Number of Visits 12   ? Date for PT Re-Evaluation 10/12/21   ? Authorization - Visit Number 10   ? PT Start Time [phone]   ? PT Stop Time [phone]   ? PT Time Calculation (min) 43 min   ? Activity Tolerance Patient tolerated treatment well;Patient limited by pain   ? Behavior During Therapy [facility] for tasks assessed/performed   ? ?  ?  ? ?  ? ? ?Past Medical History:  ?Diagnosis Date  ? Back pain   ? Chronic urinary tract infection   ? ? ?Past Surgical History:  ?Procedure Laterality Date  ? ABDOMINAL HYSTERECTOMY    ? ovaries remain  ? APPENDECTOMY    ? CARPAL TUNNEL RELEASE    ? CHOLECYSTECTOMY    ? COLONOSCOPY WITH PROPOFOL N/A 11/06/2015  ? Procedure: COLONOSCOPY WITH PROPOFOL;  Surgeon: Lucilla Lame, MD;  Location: [place];  Service: Endoscopy;  Laterality: N/A;  ? ESOPHAGOGASTRODUODENOSCOPY (EGD) WITH PROPOFOL N/A 11/06/2015  ? Procedure: ESOPHAGOGASTRODUODENOSCOPY (EGD) WITH PROPOFOL;  Surgeon: Lucilla Lame, MD;  Location: [place];  Service: Endoscopy;  Laterality: N/A;  ? POLYPECTOMY  11/06/2015  ? Procedure: POLYPECTOMY;  Surgeon: Lucilla Lame, MD;  Location: [place];  Service: Endoscopy;;  ? ? ?There were no vitals filed for this visit. ? ? Subjective Assessment - 09/24/21 0733   ? ? Subjective Patient reports she can 'barely feel it" in regard to her pain at this time. Pt is compliant with HEP. Pt reports some low back pain with prolonged sitting - she feels that getting up to move will help her.   ? 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  She enjoys walking and  reading for hobbies  Walking is hard right now because her legs feel tired.   ? Limitations Standing;Walking;House hold activities   ? How long can you sit comfortably? not limited, she uses a lumbar pillow   ? How long can you walk comfortably? few minutes (because of back pain and hip pain)   ? Diagnostic tests x-ray (-) for fx, has had DEXA scan (she notes some changes in her hips but nothing that she recalls in her spine)   ? Patient Stated Goals for the pain to go away; or at least having less pain in her mid back at the end of the day and in the morning.  Also would like to begin a walking program again.   ? Pain Onset More than a month ago   ? ?  ?  ? ?  ? ? ? ? ?OBJECTIVE FINDINGS ?  ?AROM ?Lateral flexion: R 100%, L 75% ?Thoracolumbar rotation: R 100%, L 100% ? ? ? ? ? ?  ?TREATMENT ?  ?Manual Therapy - for symptom modulation, soft tissue sensitivity and mobility, joint mobility, ROM  ?  ?STM/DTM R T10-L3 paraspinals ?CPA at T10-L3, gr I-II for pain control, gr III for thoracolumbar mobility  ?UPA R at T10-L2 gr III for increasing joint mobility  ?  ?  ?Therapeutic Exercise - for improved  soft tissue flexibility and extensibility as needed for ROM, graded thoracolumbar movement to decrease threat to truncal AROM ?  ?-Lower trunk rotation with QL bias; x10 ea side, 5 sec hold  ?-Reach and roll (scapular protraction/forward reach then open book); x10 on each side ?-Lateral flexion stretch in sitting with contralateral arm abduction; x10 ea dir, 5 sec hold  ?-Seated bilateral ER with chest lift; Green Tband; 2x15, with tactile and verbal cueing for scapular depression ?-Standing bilateral row with upright posture, with Nautilus, 30-lbs; 2x10; tactile and verbal cueing for scapular depression/retraction and upright posture ?  ?  ?  ?*not today* ?-Thoracic extension over back of chair: x10  ?-Thoracic extension with foam roll at wall (serratus slide technique); 2x10, 3 sec hold at top ?-Bow and arrow, sidelying  thoracic rotation; x10 on each side with moderate cueing for thoracic rotation versus pure shoulder HABD ?-Physioball roll outs forwards, R/L deviations: 2x5/direction with 2-3 sec holds. ?-Scap retractions: 3x20 ?-Thoracic rotation: x10 ?Nu-Step L2 for 5 min with UE/LE use to introduce gentle thoracic rotation AROM. VC's to keep in pain free AROM . ?  ?  ?  ?  ?ASSESSMENT ?Patient reports minimal pain at this time and demonstrates no pain behaviors (also no pain reports) with bed mobility tasks e.g. rolling, supine to sit. She demonstrates normalizing ROM in most planes for thoracolumbar AROM with only mild motion loss into L lateral flexion. Pt is tolerating sit to stand following her work duties well, but she does experience moderate low back stiffness and pain with prolonged sitting. Discussed activity modification and benefit of introducing regular movement into her workday. Patient will benefit from continued skilled therapeutic intervention to address remaining pain and thoracolumbar mobility deficits for improved function and QoL.  ?  ?  ?   ? PT Short Term Goals - 09/01/21 1756   ? ?  ? PT SHORT TERM GOAL #1  ? Title Pt will be able to stand with neutral spine posture x 5 min with <5/10 back pain   ? Baseline unable   ? Time 3   ? Period Weeks   ? Status New   ? Target Date 09/22/21   ? ?  ?  ? ?  ? ? ? ? PT Long Term Goals - 09/01/21 1756   ? ?  ? PT LONG TERM GOAL #1  ? Title Pt will be able to transfer from sit to supine and supine to sit with <5/10 back pain utilizing apprpriate body mechanics to promote improved bed mobility at home   ? Baseline currently sleeping propped up   ? Time 6   ? Period Weeks   ? Status New   ? Target Date 10/12/21   ?  ? PT LONG TERM GOAL #2  ? Title Improve thoracolumbar extension strength to 5/5 to promote ability to stand and walk with upright posture x 10 min   ? Baseline 3-/5   ? Time 6   ? Period Weeks   ? Status New   ? Target Date 10/12/21   ?  ? PT LONG TERM GOAL #3   ? Title Improve FOTO by >5 points indicating improved ability to perform bed mobility and walking activities without being limited by back pain   ? Baseline 55/63   ? Time 6   ? Period Weeks   ? Status New   ? ?  ?  ? ?  ? ? ? ? ? ? ? ? Plan -  09/24/21 0848   ? ? Clinical Impression Statement Patient reports minimal pain at this time and demonstrates no pain behaviors (also no pain reports) with bed mobility tasks e.g. rolling, supine to sit. She demonstrates normalizing ROM in most planes for thoracolumbar AROM with only mild motion loss into L lateral flexion. Pt is tolerating sit to stand following her work duties well, but she does experience moderate low back stiffness and pain with prolonged sitting. Discussed activity modification and benefit of introducing regular movement into her workday. Patient will benefit from continued skilled therapeutic intervention to address remaining pain and thoracolumbar mobility deficits for improved function and QoL.   ? Personal Factors and Comorbidities Age;Time since onset of injury/illness/exacerbation;Comorbidity 1   ? Examination-Activity Limitations Bed Mobility;Transfers;Sleep;Lift;Reach Overhead   ? Examination-Participation Restrictions Community Activity;Occupation   ? Stability/Clinical Decision Making Evolving/Moderate complexity   ? Rehab Potential Good   ? PT Frequency 2x / week   ? PT Duration 6 weeks   ? PT Treatment/Interventions ADLs/Self Care Home Management;Moist Heat;Functional mobility training;Therapeutic activities;Therapeutic exercise;Neuromuscular re-education;Manual techniques   ? PT Next Visit Plan thoracic spine ROM, manual therapy, diaphragmatic breathing; progress with graded loading and recovery of function as tolerated.   ? PT Home Exercise Plan Access Code: [email or web address]   ? Consulted and Agree with Plan of Care Patient   ? ?  ?  ? ?  ? ? ?Patient will benefit from skilled therapeutic intervention in order to improve the following deficits and  impairments:  Decreased range of motion, Difficulty walking, Pain, Decreased activity tolerance, Hypomobility, Impaired flexibility, Decreased mobility, Decreased strength, Postural dysfunction ? ?Vi

## 2021-09-29 ENCOUNTER — Other Ambulatory Visit: Payer: Self-pay

## 2021-09-29 ENCOUNTER — Encounter: Payer: Self-pay | Admitting: Physical Therapy

## 2021-09-29 ENCOUNTER — Ambulatory Visit: Payer: Medicare HMO | Admitting: Physical Therapy

## 2021-09-29 DIAGNOSIS — R293 Abnormal posture: Secondary | ICD-10-CM

## 2021-09-29 DIAGNOSIS — M546 Pain in thoracic spine: Secondary | ICD-10-CM

## 2021-09-29 DIAGNOSIS — M549 Dorsalgia, unspecified: Secondary | ICD-10-CM

## 2021-09-29 NOTE — Therapy (Signed)
[place] ?[facility] ?[address]. [place], [place], [address] ?Phone: [phone]   Fax:  [phone] ? ?Physical Therapy Treatment/Physical Therapy Progress Note ? ? ?Dates of reporting period  09/01/21   to   09/29/21 ? ? ?Patient Details  ?Name: Stephanie Ferguson ?MRN: [id number] ?Date of Birth: May 31, 1951 ?No data recorded ? ?Encounter Date: 09/29/2021 ? ? PT End of Session - 10/01/21 5885   ? ? Visit Number 7   ? Number of Visits 12   ? Date for PT Re-Evaluation 10/12/21   ? Authorization - Visit Number 10   ? PT Start Time 1117   ? PT Stop Time 0277   ? PT Time Calculation (min) 42 min   ? Activity Tolerance Patient tolerated treatment well;Patient limited by pain   ? Behavior During Therapy [facility] for tasks assessed/performed   ? ?  ?  ? ?  ? ? ?Past Medical History:  ?Diagnosis Date  ? Back pain   ? Chronic urinary tract infection   ? ? ?Past Surgical History:  ?Procedure Laterality Date  ? ABDOMINAL HYSTERECTOMY    ? ovaries remain  ? APPENDECTOMY    ? CARPAL TUNNEL RELEASE    ? CHOLECYSTECTOMY    ? COLONOSCOPY WITH PROPOFOL N/A 11/06/2015  ? Procedure: COLONOSCOPY WITH PROPOFOL;  Surgeon: Lucilla Lame, MD;  Location: [place];  Service: Endoscopy;  Laterality: N/A;  ? ESOPHAGOGASTRODUODENOSCOPY (EGD) WITH PROPOFOL N/A 11/06/2015  ? Procedure: ESOPHAGOGASTRODUODENOSCOPY (EGD) WITH PROPOFOL;  Surgeon: Lucilla Lame, MD;  Location: [place];  Service: Endoscopy;  Laterality: N/A;  ? POLYPECTOMY  11/06/2015  ? Procedure: POLYPECTOMY;  Surgeon: Lucilla Lame, MD;  Location: [place];  Service: Endoscopy;;  ? ? ?There were no vitals filed for this visit. ? ? Subjective Assessment - 10/01/21 0623   ? ? Subjective Patient reports her condition is just about fully resolved. She denies pain at arrival. Patient reports doing well with getting up from her chair and with bed mobility. She reports some ongoing low back pain at this time. She reports no  signficant mid-back pain at this time. Patient reports she spoke to her physician about her low back and he was considering referral to spine specialist. Patient reports some sciatica affecting her R lower limb. Pt reports that her primary symptom for which she sought treatment is minimal at this time. Patient feels she is at 100% SANE score at this time. No sleep disturbance. Pt reports lower lumber pain with prolonged standing that is intermittent. Pt denies notable limitation with static standing. Pt reports more difficulty with prolonged walking.   ? 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  She enjoys walking and reading for hobbies  Walking is hard right now because her legs feel tired.   ? Limitations Standing;Walking;House hold activities   ? How long can you sit comfortably? not limited, she uses a lumbar pillow   ? How long can you walk comfortably? few minutes (because of back pain and hip pain)   ? Diagnostic tests x-ray (-) for fx, has had DEXA scan (she notes some changes in her hips but nothing that she recalls in her spine)   ? Patient Stated Goals for the pain to go away; or at least having less pain in her mid back at the end of the day and in the morning.  Also would like to begin a walking  program again.   ? Pain Onset More than a month ago   ? ?  ?  ? ?  ? ? ? ?OBJECTIVE ? ?AROM (degrees) ?R/L (all movements include overpressure unless otherwise stated) ?Thoracolumbar flexion: WFL, no pain  ?Thoracolumbar extension (30): WNL with no pain reproduction  ?Thoracolumbar lateral flexion: normal with no pain ?Thoracic and Lumbar rotation (30 degrees):  R WNL, L WNL (no pain reproduction) ?  ?  ?Passive Accessory Intervertebral Motion (PAIVM) ?No reproduction of pain with CPA/UPA T3-L3 Generally hypomobile along T3-T12.  ?  ?Bed Mobility: ?Patient readily completes supine to sit, sitting to supine, rolling, and bridging without reproduction of  symptoms today ?  ? ? ?  ?TREATMENT ?  ? ?Therapeutic Activities ? ?Re-assessment performed (see above and updated Goal section below) ? ?  ?  ?Therapeutic Exercise - for improved soft tissue flexibility and extensibility as needed for ROM, graded thoracolumbar movement to decrease threat to truncal AROM ?  ?-Bridge, supine; 2x10 ?-Standing row with Blue Tband anchored in door; 1x10 (for HEP update) ? ?Patient education: Discussed current progress obtained in PT, plan of care moving forward with emphasis on HEP/independent management. Updated and reviewed home program and provided resistance band for home.  ? ? ? ?*next visit* ?-Standing bilateral row with upright posture, with Nautilus, 30-lbs; 2x10; tactile and verbal cueing for scapular depression/retraction and upright posture ?  ?  ?  ?*not today* ?-Lateral flexion stretch in sitting with contralateral arm abduction; x10 ea dir, 5 sec hold  ?-Lower trunk rotation with QL bias; x10 ea side, 5 sec hold  ?-Reach and roll (scapular protraction/forward reach then open book); x10 on each side ?-Seated bilateral ER with chest lift; Green Tband; 2x15, with tactile and verbal cueing for scapular depression ?-Thoracic extension over back of chair: x10  ?-Thoracic extension with foam roll at wall (serratus slide technique); 2x10, 3 sec hold at top ?-Bow and arrow, sidelying thoracic rotation; x10 on each side with moderate cueing for thoracic rotation versus pure shoulder HABD ?-Physioball roll outs forwards, R/L deviations: 2x5/direction with 2-3 sec holds. ?-Scap retractions: 3x20 ?-Thoracic rotation: x10 ?Nu-Step L2 for 5 min with UE/LE use to introduce gentle thoracic rotation AROM. VC's to keep in pain free AROM . ?  ?  ?  ?  ?ASSESSMENT ?Patient has met all goals with exception of long-term FOTO score. She reports that her primary pain complaint for this episode of care is essentially resolved. She does have intermittent comorbid lower lumbar pain associated with  prolonged sitting, and this may impact her FOTO score. Discussed option for shifting focus to her low back; pt elects to continue with HEP following this week given notable improvement to date. She demonstrates normal pain-free ROM and no pain with bed mobility. Patient has one remaining visit to fully ascertain home program/self-management program and ensure readiness for completion of formal PT intervention. ?  ?  ?   ? ? ? ? PT Short Term Goals - 09/29/21 1125   ? ?  ? PT SHORT TERM GOAL #1  ? Title Pt will be able to stand with neutral spine posture x 5 min with <5/10 back pain   ? Baseline unable.    09/29/21: Pt denies limitation with duration of standing at this time.   ? Time 3   ? Period Weeks   ? Status Achieved   ? Target Date 09/22/21   ? ?  ?  ? ?  ? ? ? ?  PT Long Term Goals - 09/29/21 1126   ? ?  ? PT LONG TERM GOAL #1  ? Title Pt will be able to transfer from sit to supine and supine to sit with <5/10 back pain utilizing apprpriate body mechanics to promote improved bed mobility at home   ? Baseline currently sleeping propped up.    09/28/21: performed today with no pain with good mechanics taught by previous PT   ? Time 6   ? Period Weeks   ? Status Achieved   ? Target Date 10/12/21   ?  ? PT LONG TERM GOAL #2  ? Title Improve thoracolumbar extension strength to 5/5 to promote ability to stand and walk with upright posture x 10 min   ? Baseline 3-/5.  09/29/21: 5/5 trunk extension MMT (trunk lift performed with chest lifting from table without notable difficulty)   ? Time 6   ? Period Weeks   ? Status Achieved   ? Target Date 10/12/21   ?  ? PT LONG TERM GOAL #3  ? Title Improve FOTO by >5 points indicating improved ability to perform bed mobility and walking activities without being limited by back pain   ? Baseline 55/63.   09/29/21: 57   ? Time 6   ? Period Weeks   ? Status On-going   ? ?  ?  ? ?  ? ? ? ? ? ? ? ? Plan - 10/01/21 5852   ? ? Clinical Impression Statement Patient has met all goals with  exception of long-term FOTO score. She reports that her primary pain complaint for this episode of care is essentially resolved. She does have intermittent comorbid lower lumbar pain associated with prolonged sit

## 2021-10-01 ENCOUNTER — Other Ambulatory Visit: Payer: Self-pay

## 2021-10-01 ENCOUNTER — Ambulatory Visit: Payer: Medicare HMO | Admitting: Physical Therapy

## 2021-10-01 DIAGNOSIS — M549 Dorsalgia, unspecified: Secondary | ICD-10-CM | POA: Diagnosis not present

## 2021-10-01 DIAGNOSIS — M546 Pain in thoracic spine: Secondary | ICD-10-CM | POA: Diagnosis not present

## 2021-10-01 DIAGNOSIS — R293 Abnormal posture: Secondary | ICD-10-CM

## 2021-10-01 NOTE — Therapy (Signed)
[place] ?[facility] ?[address]. [place], [place], [address] ?Phone: [phone]   Fax:  [phone] ? ?Physical Therapy Treatment ? ?Patient Details  ?Name: Stephanie Ferguson ?MRN: [id number] ?Date of Birth: [birth year] ?No data recorded ? ?Encounter Date: 10/01/2021 ? ? PT End of Session - 10/03/21 1046   ? ? Visit Number 8   ? Number of Visits 12   ? Date for PT Re-Evaluation 10/12/21   ? Authorization - Visit Number 10   ? PT Start Time [id number]   ? PT Stop Time 0848   ? PT Time Calculation (min) 34 min   ? Activity Tolerance Patient tolerated treatment well;Patient limited by pain   ? Behavior During Therapy [facility] for tasks assessed/performed   ? ?  ?  ? ?  ? ? ?Past Medical History:  ?Diagnosis Date  ? Back pain   ? Chronic urinary tract infection   ? ? ?Past Surgical History:  ?Procedure Laterality Date  ? ABDOMINAL HYSTERECTOMY    ? ovaries remain  ? APPENDECTOMY    ? CARPAL TUNNEL RELEASE    ? CHOLECYSTECTOMY    ? COLONOSCOPY WITH PROPOFOL N/A 11/06/2015  ? Procedure: COLONOSCOPY WITH PROPOFOL;  Surgeon: Lucilla Lame, MD;  Location: [place];  Service: Endoscopy;  Laterality: N/A;  ? ESOPHAGOGASTRODUODENOSCOPY (EGD) WITH PROPOFOL N/A 11/06/2015  ? Procedure: ESOPHAGOGASTRODUODENOSCOPY (EGD) WITH PROPOFOL;  Surgeon: Lucilla Lame, MD;  Location: [place];  Service: Endoscopy;  Laterality: N/A;  ? POLYPECTOMY  11/06/2015  ? Procedure: POLYPECTOMY;  Surgeon: Lucilla Lame, MD;  Location: [place];  Service: Endoscopy;;  ? ? ?There were no vitals filed for this visit. ? ? Subjective Assessment - 10/03/21 1046   ? ? Subjective Patient reports that she had fall on Tuesday when she stepped out of her car - she tripped over purse strap and fell directly onto R knee. Patient reports no other falls in last 6 months. Patient reports no significant mid-back pain at arrival to PT/no flare-up following this incident. She states that he knee feels like a bruise  around her abrasion. Patient reports no significant instability or issues with weightbearing onto RLE.   ? 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  She enjoys walking and reading for hobbies  Walking is hard right now because her legs feel tired.   ? Limitations Standing;Walking;House hold activities   ? How long can you sit comfortably? not limited, she uses a lumbar pillow   ? How long can you walk comfortably? few minutes (because of back pain and hip pain)   ? Diagnostic tests x-ray (-) for fx, has had DEXA scan (she notes some changes in her hips but nothing that she recalls in her spine)   ? Patient Stated Goals for the pain to go away; or at least having less pain in her mid back at the end of the day and in the morning.  Also would like to begin a walking program again.   ? Pain Onset More than a month ago   ? ?  ?  ? ?  ? ? ? ? ? ?OBJECTIVE FINDINGS ? ?Knee AROM and PROM WFL ?No pain with R knee PROM c overpressure ? ?Special Tests ?Anterior Drawer: Negative  ?Posterior Sag: Negative ?Valgus Stress: Negative ?Varus Stress: Negative ?McMurray Test: Negative ? ?No point tenderness along peripatellar region, tibial plateau, or joint line ? ? ? ?  ?  TREATMENT ?  ? ?Therapeutic Activities ? ?Quick screen to determine need for referral to orthopedist or urgent care for radiographs (see above).  ? ?Pt edu: discussed her condition, lack of red flags and minimal significant findings, monitoring condition with expected continued improvement in knee soreness following fall. Discussed continued HEP and updated patient's printout. ?  ? ?Therapeutic Exercise - for improved soft tissue flexibility and extensibility as needed for ROM, graded thoracolumbar movement to decrease threat to truncal AROM ?  ?-Alternating hip extension; 2x10 alt  ?-Reach and roll; x10 on each side  ?-Standing bilateral row with upright posture, with Nautilus, 30-lbs; 2x10; tactile and  verbal cueing for scapular depression/retraction and upright posture ? ?  ?Patient education: Follow-up to determine means of contact for remote FOTO with patient completing trial of HEP/self-management.  ? ?  ?  ?  ?*not today* ?-Standing row with Blue Tband anchored in door; 1x10 (for HEP update) ?-Lateral flexion stretch in sitting with contralateral arm abduction; x10 ea dir, 5 sec hold  ?-Lower trunk rotation with QL bias; x10 ea side, 5 sec hold  ?-Reach and roll (scapular protraction/forward reach then open book); x10 on each side ?-Seated bilateral ER with chest lift; Green Tband; 2x15, with tactile and verbal cueing for scapular depression ?-Thoracic extension over back of chair: x10  ?-Thoracic extension with foam roll at wall (serratus slide technique); 2x10, 3 sec hold at top ?-Bow and arrow, sidelying thoracic rotation; x10 on each side with moderate cueing for thoracic rotation versus pure shoulder HABD ?-Physioball roll outs forwards, R/L deviations: 2x5/direction with 2-3 sec holds. ?-Scap retractions: 3x20 ?-Thoracic rotation: x10 ?Nu-Step L2 for 5 min with UE/LE use to introduce gentle thoracic rotation AROM. VC's to keep in pain free AROM . ?  ?  ?  ?  ?ASSESSMENT ?Patient has made excellent progress with PT for mid-back pain to date. She unfortunately has recent fall during which she tripped over long strap attached to her purse and fell directly onto R knee with initial increase in swelling that has since decreased. No major findings indicating further screening/referral to MD for serious injury. Pt has apparent abrasion along anterior surface of patella with expected contusion from direct impact (no apparent ecchymosis along knee today). Patient denies significant increase in lower limb pain today. She is appropriate for continued home program after today given her current progress. Will re-assess FOTO goal remotely at future date.  ?  ?  ? PT Short Term Goals - 09/29/21 1125   ? ?  ? PT SHORT  TERM GOAL #1  ? Title Pt will be able to stand with neutral spine posture x 5 min with <5/10 back pain   ? Baseline unable.    09/29/21: Pt denies limitation with duration of standing at this time.   ? Time 3   ? Period Weeks   ? Status Achieved   ? Target Date 09/22/21   ? ?  ?  ? ?  ? ? ? ? PT Long Term Goals - 09/29/21 1126   ? ?  ? PT LONG TERM GOAL #1  ? Title Pt will be able to transfer from sit to supine and supine to sit with <5/10 back pain utilizing apprpriate body mechanics to promote improved bed mobility at home   ? Baseline currently sleeping propped up.    09/28/21: performed today with no pain with good mechanics taught by previous PT   ? Time 6   ? Period Weeks   ?  Status Achieved   ? Target Date 10/12/21   ?  ? PT LONG TERM GOAL #2  ? Title Improve thoracolumbar extension strength to 5/5 to promote ability to stand and walk with upright posture x 10 min   ? Baseline 3-/5.  09/29/21: 5/5 trunk extension MMT (trunk lift performed with chest lifting from table without notable difficulty)   ? Time 6   ? Period Weeks   ? Status Achieved   ? Target Date 10/12/21   ?  ? PT LONG TERM GOAL #3  ? Title Improve FOTO by >5 points indicating improved ability to perform bed mobility and walking activities without being limited by back pain   ? Baseline 55/63.   09/29/21: 57   ? Time 6   ? Period Weeks   ? Status On-going   ? ?  ?  ? ?  ? ? ? ? ? ? ? ? Plan - 10/03/21 1053   ? ? Clinical Impression Statement Patient has made excellent progress with PT for mid-back pain to date. She unfortunately has recent fall during which she tripped over long strap attached to her purse and fell directly onto R knee with initial increase in swelling that has since decreased. No major findings indicating further screening/referral to MD for serious injury. Pt has apparent abrasion along anterior surface of patella with expected contusion from direct impact (no apparent ecchymosis along knee today). Patient denies significant  increase in lower limb pain today. She is appropriate for continued home program after today given her current progress. Will re-assess FOTO goal remotely at future date.   ? Personal Factors and Comorbidities Ag

## 2022-10-05 ENCOUNTER — Ambulatory Visit (INDEPENDENT_AMBULATORY_CARE_PROVIDER_SITE_OTHER): Payer: Medicare HMO

## 2022-10-05 ENCOUNTER — Other Ambulatory Visit: Payer: Self-pay

## 2022-10-05 ENCOUNTER — Ambulatory Visit
Admission: EM | Admit: 2022-10-05 | Discharge: 2022-10-05 | Disposition: A | Discharge status: Home / Self Care | Payer: Medicare HMO | Attending: Physician Assistant | Admitting: Physician Assistant

## 2022-10-05 DIAGNOSIS — R051 Acute cough: Secondary | ICD-10-CM | POA: Insufficient documentation

## 2022-10-05 DIAGNOSIS — R0602 Shortness of breath: Secondary | ICD-10-CM | POA: Diagnosis not present

## 2022-10-05 DIAGNOSIS — J111 Influenza due to unidentified influenza virus with other respiratory manifestations: Secondary | ICD-10-CM | POA: Diagnosis not present

## 2022-10-05 DIAGNOSIS — Z1152 Encounter for screening for COVID-19: Secondary | ICD-10-CM | POA: Diagnosis not present

## 2022-10-05 DIAGNOSIS — R509 Fever, unspecified: Secondary | ICD-10-CM | POA: Insufficient documentation

## 2022-10-05 LAB — RAPID INFLUENZA A&B ANTIGENS
Influenza A (ARMC): NEGATIVE
Influenza B (ARMC): NEGATIVE

## 2022-10-05 LAB — GROUP A STREP BY PCR: Group A Strep by PCR: NOT DETECTED

## 2022-10-05 LAB — SARS CORONAVIRUS 2 BY RT PCR: SARS Coronavirus 2 by RT PCR: NEGATIVE

## 2022-10-05 MED ORDER — ALBUTEROL SULFATE HFA 108 (90 BASE) MCG/ACT IN AERS: 1.0000 | INHALATION_SPRAY | Freq: Four times a day (QID) | RESPIRATORY_TRACT | 0 refills | Status: AC | PRN

## 2022-10-05 NOTE — ED Provider Notes (Signed)
[facility]    CSN: [id number] Arrival date & time: 10/05/22  [id number]      History   Chief Complaint Chief Complaint  Patient presents with   Fever    Fever and nausea that started today and productive cough that started yesterday.    HPI Stephanie Ferguson is a 73 y.o. female presenting for fever, fatigue, nausea, occasionally productive cough, sore throat and shortness of breath that began 2 days ago.  She denies sinus pain, chest pain, abdominal pain, vomiting or diarrhea.  Reports a little bit of chest pressure but denies any pleuritic pain.  Denies any sick contacts or known exposure to flu, COVID or strep.  Reports that she has had vaccinations for pneumonia, flu and COVID.  She has not taken anything for symptoms today.  No other complaints or concerns.  HPI  Past Medical History:  Diagnosis Date   Back pain    Chronic urinary tract infection     Patient Active Problem List   Diagnosis Date Noted   Adenomatous colon polyp    Early satiety    Nausea    Gastritis, Helicobacter pylori    Sigmoid diverticulosis 10/03/2015   Fatty liver 10/03/2015   Hyperlipidemia, mild 10/03/2015    Past Surgical History:  Procedure Laterality Date   ABDOMINAL HYSTERECTOMY     ovaries remain   APPENDECTOMY     CARPAL TUNNEL RELEASE     CHOLECYSTECTOMY     COLONOSCOPY WITH PROPOFOL N/A 11/06/2015   Procedure: COLONOSCOPY WITH PROPOFOL;  Surgeon: Lucilla Lame, MD;  Location: [place];  Service: Endoscopy;  Laterality: N/A;   ESOPHAGOGASTRODUODENOSCOPY (EGD) WITH PROPOFOL N/A 11/06/2015   Procedure: ESOPHAGOGASTRODUODENOSCOPY (EGD) WITH PROPOFOL;  Surgeon: Lucilla Lame, MD;  Location: [place];  Service: Endoscopy;  Laterality: N/A;   POLYPECTOMY  11/06/2015   Procedure: POLYPECTOMY;  Surgeon: Lucilla Lame, MD;  Location: [place];  Service: Endoscopy;;    OB History   No obstetric history on file.      Home Medications    Prior to  Admission medications   Medication Sig Start Date End Date Taking? Authorizing Provider  albuterol (VENTOLIN HFA) 108 (90 Base) MCG/ACT inhaler Inhale 1-2 puffs into the lungs every 6 (six) hours as needed for wheezing or shortness of breath. 10/05/22  Yes Laurene Footman B, PA-C  albuterol (VENTOLIN HFA) 108 (90 Base) MCG/ACT inhaler Inhale 1-2 puffs into the lungs every 4 (four) hours as needed for wheezing or shortness of breath. 11/20/20   Melynda Ripple, MD  pantoprazole (PROTONIX) 40 MG tablet Take 1 tablet (40 mg total) by mouth daily. 12/10/15   Glean Hess, MD  Spacer/Aero-Holding Chambers (AEROCHAMBER PLUS) inhaler Use with inhaler 11/20/20   Melynda Ripple, MD  cetirizine (ZYRTEC) 10 MG tablet Take 10 mg by mouth daily.  11/20/20  [provider]    Family History Family History  Problem Relation Age of Onset   Throat cancer Mother    CAD Father    Diabetes Brother     Social History Social History   Tobacco Use   Smoking status: Never   Smokeless tobacco: Never  Substance Use Topics   Alcohol use: No    Alcohol/week: 0.0 standard drinks of alcohol   Drug use: No     Allergies   Patient has no known allergies.   Review of Systems Review of Systems  Constitutional:  Positive for chills, fatigue and fever. Negative for diaphoresis.  HENT:  Positive for congestion, rhinorrhea and sore throat. Negative for ear pain, sinus pressure and sinus pain.   Respiratory:  Positive for cough. Negative for shortness of breath.   Cardiovascular:  Negative for chest pain.  Gastrointestinal:  Positive for nausea. Negative for abdominal pain, diarrhea and vomiting.  Musculoskeletal:  Negative for myalgias.  Skin:  Negative for rash.  Neurological:  Negative for weakness and headaches.  Hematological:  Negative for adenopathy.     Physical Exam Triage Vital Signs ED Triage Vitals [10/05/22 0957]  Enc Vitals Group     BP 126/80     Pulse Rate (!) 118     Resp  (!) 22     Temp (!) 101.3 F (38.5 C)     Temp src      SpO2 99 %     Weight      Height      Head Circumference      Peak Flow      Pain Score 0     Pain Loc      Pain Edu?      Excl. in [place]?    No data found.  Updated Vital Signs BP 126/80   Pulse (!) 118   Temp (!) 101.3 F (38.5 C)   Resp (!) 22   SpO2 99%   Physical Exam Vitals and nursing note reviewed.  Constitutional:      General: She is not in acute distress.    Appearance: Normal appearance. She is ill-appearing. She is not toxic-appearing.  HENT:     Head: Normocephalic and atraumatic.     Nose: Nose normal.     Mouth/Throat:     Mouth: Mucous membranes are moist.     Pharynx: Oropharynx is clear. Posterior oropharyngeal erythema present.  Eyes:     General: No scleral icterus.       Right eye: No discharge.        Left eye: No discharge.     Conjunctiva/sclera: Conjunctivae normal.  Cardiovascular:     Rate and Rhythm: Regular rhythm. Tachycardia present.     Heart sounds: Normal heart sounds.  Pulmonary:     Effort: Respiratory distress (mild, increased RR with speaking and walking) present.     Breath sounds: Normal breath sounds. No wheezing, rhonchi or rales.  Abdominal:     Palpations: Abdomen is soft.     Tenderness: There is no abdominal tenderness.  Musculoskeletal:     Cervical back: Neck supple.  Skin:    General: Skin is dry.  Neurological:     General: No focal deficit present.     Mental Status: She is alert. Mental status is at baseline.     Motor: No weakness.     Gait: Gait normal.  Psychiatric:        Mood and Affect: Mood normal.        Behavior: Behavior normal.        Thought Content: Thought content normal.      UC Treatments / Results  Labs (all labs ordered are listed, but only abnormal results are displayed) Labs Reviewed  SARS CORONAVIRUS 2 BY RT PCR  RAPID INFLUENZA A&B ANTIGENS  GROUP A STREP BY PCR    EKG   Radiology DG Chest 2 View  Result Date:  10/05/2022 CLINICAL DATA:  Fever, nausea, cough EXAM: CHEST - 2 VIEW COMPARISON:  05/26/2018 FINDINGS: The heart size and mediastinal contours are within normal limits. Both lungs are clear. The visualized  skeletal structures are unremarkable. IMPRESSION: No active cardiopulmonary disease. Electronically Signed   By: Randa Ngo M.D.   On: 10/05/2022 10:35    Procedures Procedures (including critical care time)  Medications Ordered in UC Medications - No data to display  Initial Impression / Assessment and Plan / UC Course  I have reviewed the triage vital signs and the nursing notes.  Pertinent labs & imaging results that were available during my care of the patient were reviewed by me and considered in my medical decision making (see chart for details).   71 year old female presents for fever, fatigue, chills, cough, sore throat, nausea and shortness of breath that began 2 days ago.  Temp 101.3 degrees.  Pulse elevated at 118 bpm.  Respiratory rate increased at 22.  She has mild respiratory distress/shortness of breath when speaking and walking.  Oxygen stable at 99%.  She has mild posterior pharyngeal erythema.  Her chest is clear to auscultation.  COVID testing ordered and obtained by nursing staff is negative. Chest x-ray ordered to assess for possible underlying pneumonia.  X-ray normal.  Ordered rapid flu and strep testing.  Negative.  Discussed with patient that all of her testing is negative today but I still of high suspicion for influenza or COVID-19.  Discussed supportive care with her.  Advised her to continue the Robitussin, antipyretics, increasing rest and fluids.  Refilled albuterol inhaler for her.  She has used an inhaler before but says she is out of the inhaler.  Encouraged her to monitor her condition closely and if at any point her symptoms worsen or she is not starting to improve in the next couple days she should seek immediate reevaluation.  Discussed ED precautions  with her.  Work note provided.  Patient with acute illness and systemic symptoms.   Final Clinical Impressions(s) / UC Diagnoses   Final diagnoses:  Influenza-like illness  Fever, unspecified  Acute cough  Shortness of breath     Discharge Instructions      -Negative strep, COVID, flu, RSV testing.  Normal chest x-ray.  Noticed pneumonia. - Your symptoms still may be consistent with flu and COVID.  We discussed treatment for these conditions as sometimes antiviral medication but mostly supportive care with cough medication, increasing rest and fluids, antipyretics to control fever and I have sent inhaler to help with your shortness of breath. - If you feel that your symptoms are worsening or not improving over the next couple days he should seek reevaluation in the emergency department. - Should be improving and fever should be breaking in the next 2 days.     ED Prescriptions     Medication Sig Dispense Auth. Provider   albuterol (VENTOLIN HFA) 108 (90 Base) MCG/ACT inhaler Inhale 1-2 puffs into the lungs every 6 (six) hours as needed for wheezing or shortness of breath. 1 g Danton Clap, PA-C      PDMP not reviewed this encounter.   Danton Clap, PA-C 10/05/22 1147

## 2022-10-05 NOTE — Discharge Instructions (Addendum)
-  Negative strep, COVID, flu, RSV testing.  Normal chest x-ray.  Noticed pneumonia. - Your symptoms still may be consistent with flu and COVID.  We discussed treatment for these conditions as sometimes antiviral medication but mostly supportive care with cough medication, increasing rest and fluids, antipyretics to control fever and I have sent inhaler to help with your shortness of breath. - If you feel that your symptoms are worsening or not improving over the next couple days he should seek reevaluation in the emergency department. - Should be improving and fever should be breaking in the next 2 days.

## 2022-10-05 NOTE — ED Triage Notes (Signed)
Pt c/o Fever and nausea that started today and productive cough that started yesterday.

## 2024-02-02 ENCOUNTER — Ambulatory Visit
Admission: EM | Admit: 2024-02-02 | Discharge: 2024-02-02 | Disposition: A | Discharge status: Home / Self Care | Attending: Physician Assistant | Admitting: Physician Assistant

## 2024-02-02 DIAGNOSIS — J029 Acute pharyngitis, unspecified: Secondary | ICD-10-CM

## 2024-02-02 DIAGNOSIS — R051 Acute cough: Secondary | ICD-10-CM

## 2024-02-02 DIAGNOSIS — R5383 Other fatigue: Secondary | ICD-10-CM | POA: Insufficient documentation

## 2024-02-02 DIAGNOSIS — H9202 Otalgia, left ear: Secondary | ICD-10-CM

## 2024-02-02 DIAGNOSIS — J069 Acute upper respiratory infection, unspecified: Secondary | ICD-10-CM | POA: Diagnosis present

## 2024-02-02 LAB — GROUP A STREP BY PCR: Group A Strep by PCR: NOT DETECTED

## 2024-02-02 LAB — SARS CORONAVIRUS 2 BY RT PCR: SARS Coronavirus 2 by RT PCR: NEGATIVE

## 2024-02-02 MED ORDER — PSEUDOEPH-BROMPHEN-DM 30-2-10 MG/5ML PO SYRP
ORAL_SOLUTION | ORAL | 0 refills | Status: AC
Start: 2024-02-02 — End: ?

## 2024-02-02 MED ORDER — PROMETHAZINE-DM 6.25-15 MG/5ML PO SYRP: 5.0000 mL | ORAL_SOLUTION | Freq: Four times a day (QID) | ORAL | 0 refills | Status: DC | PRN

## 2024-02-02 MED ORDER — IPRATROPIUM BROMIDE 0.06 % NA SOLN: 2.0000 | Freq: Four times a day (QID) | NASAL | 0 refills | Status: AC

## 2024-02-02 MED ORDER — LIDOCAINE VISCOUS HCL 2 % MT SOLN: 15.0000 mL | OROMUCOSAL | 0 refills | Status: AC | PRN

## 2024-02-02 NOTE — Discharge Instructions (Addendum)
-  Negative COVID and strep  URI/COLD SYMPTOMS: Your exam today is consistent with a viral illness. Antibiotics are not indicated at this time. Use medications as directed, including cough syrup, nasal saline, and decongestants. Your symptoms should improve over the next few days and resolve within a couple of weeks. Increase rest and fluids. F/u if symptoms worsen or predominate such as sore throat, ear pain, productive cough, shortness of breath, or if you develop high fevers or worsening fatigue over the next several days.

## 2024-02-02 NOTE — ED Provider Notes (Signed)
 [facility]    CSN: [id number] Arrival date & time: 02/02/24  1549      History   Chief Complaint Chief Complaint  Patient presents with   Ear Pain    Left ear pain, sore throat and cough    HPI Stephanie Ferguson is a 73 y.o. female presenting for fatigue, left ear pain, sore throat, congestion and dry cough x 2 days.  No fever, sinus pain, chest pain or shortness of breath.  Denies any sick contacts.  Denies taking OTC meds but no meds today. Reports that she has had vaccinations for pneumonia, flu and COVID. No other complaints or concerns.   HPI  Past Medical History:  Diagnosis Date   Back pain    Chronic urinary tract infection     Patient Active Problem List   Diagnosis Date Noted   Adenomatous colon polyp    Early satiety    Nausea    Gastritis, Helicobacter pylori    Sigmoid diverticulosis 10/03/2015   Fatty liver 10/03/2015   Hyperlipidemia, mild 10/03/2015    Past Surgical History:  Procedure Laterality Date   ABDOMINAL HYSTERECTOMY     ovaries remain   APPENDECTOMY     CARPAL TUNNEL RELEASE     CHOLECYSTECTOMY     COLONOSCOPY WITH PROPOFOL  N/A 11/06/2015   Procedure: COLONOSCOPY WITH PROPOFOL ;  Surgeon: Rogelia Copping, MD;  Location: [facility];  Service: Endoscopy;  Laterality: N/A;   ESOPHAGOGASTRODUODENOSCOPY (EGD) WITH PROPOFOL  N/A 11/06/2015   Procedure: ESOPHAGOGASTRODUODENOSCOPY (EGD) WITH PROPOFOL ;  Surgeon: Rogelia Copping, MD;  Location: [facility];  Service: Endoscopy;  Laterality: N/A;   POLYPECTOMY  11/06/2015   Procedure: POLYPECTOMY;  Surgeon: Rogelia Copping, MD;  Location: [facility];  Service: Endoscopy;;    OB History   No obstetric history on file.      Home Medications    Prior to Admission medications   Medication Sig Start Date End Date Taking? Authorizing Provider  albuterol  (VENTOLIN  HFA) 108 (90 Base) MCG/ACT inhaler Inhale 1-2 puffs into the lungs every 6 (six) hours as needed for wheezing  or shortness of breath. 10/05/22  Yes Arvis Huxley B, PA-C  brompheniramine-pseudoephedrine-DM 30-2-10 MG/5ML syrup Take 5-10 ml po QID prn cough 02/02/24  Yes Arvis Huxley B, PA-C  ipratropium (ATROVENT ) 0.06 % nasal spray Place 2 sprays into both nostrils 4 (four) times daily. 02/02/24  Yes Arvis Huxley B, PA-C  lidocaine  (XYLOCAINE ) 2 % solution Use as directed 15 mLs in the mouth or throat every 3 (three) hours as needed for mouth pain (swish and spit). 02/02/24  Yes Arvis Huxley NOVAK, PA-C  methimazole (TAPAZOLE) 5 MG tablet Take 7.5 mg by mouth. 12/09/23 12/08/24 Yes [provider]  omeprazole  (PRILOSEC) 40 MG capsule Take 40 mg by mouth 2 (two) times daily.   Yes [provider]  promethazine -dextromethorphan (PROMETHAZINE -DM) 6.25-15 MG/5ML syrup Take 5 mLs by mouth 4 (four) times daily as needed. 02/02/24  Yes Arvis Huxley B, PA-C  rosuvastatin (CRESTOR) 10 MG tablet Take 10 mg by mouth daily.   Yes [provider]  Spacer/Aero-Holding Chambers (AEROCHAMBER PLUS) inhaler Use with inhaler 11/20/20  Yes Van Knee, MD  albuterol  (VENTOLIN  HFA) 108 (90 Base) MCG/ACT inhaler Inhale 1-2 puffs into the lungs every 4 (four) hours as needed for wheezing or shortness of breath. 11/20/20   Van Knee, MD  pantoprazole  (PROTONIX ) 40 MG tablet Take 1 tablet (40 mg total) by mouth daily. 12/10/15   Justus Doffing  H, MD  cetirizine (ZYRTEC) 10 MG tablet Take 10 mg by mouth daily.  11/20/20  [provider]    Family History Family History  Problem Relation Age of Onset   Throat cancer Mother    CAD Father    Diabetes Brother     Social History Social History   Tobacco Use   Smoking status: Never   Smokeless tobacco: Never  Vaping Use   Vaping status: Never Used  Substance Use Topics   Alcohol use: No    Alcohol/week: 0.0 standard drinks of alcohol   Drug use: No     Allergies   Patient has no known allergies.   Review of Systems Review  of Systems  Constitutional:  Positive for fatigue. Negative for chills, diaphoresis and fever.  HENT:  Positive for congestion, ear pain, rhinorrhea and sore throat. Negative for sinus pressure and sinus pain.   Respiratory:  Positive for cough. Negative for shortness of breath.   Cardiovascular:  Negative for chest pain.  Gastrointestinal:  Negative for abdominal pain, nausea and vomiting.  Musculoskeletal:  Negative for arthralgias and myalgias.  Skin:  Negative for rash.  Neurological:  Negative for weakness and headaches.  Hematological:  Negative for adenopathy.     Physical Exam Triage Vital Signs ED Triage Vitals  Encounter Vitals Group     BP 02/02/24 1601 (!) 150/77     Girls Systolic BP Percentile --      Girls Diastolic BP Percentile --      Boys Systolic BP Percentile --      Boys Diastolic BP Percentile --      Pulse Rate 02/02/24 1601 90     Resp 02/02/24 1601 16     Temp 02/02/24 1601 98.5 F (36.9 C)     Temp Source 02/02/24 1601 Oral     SpO2 02/02/24 1601 95 %     Weight 02/02/24 1559 270 lb (122.5 kg)     Height 02/02/24 1559 5' 3 (1.6 m)     Head Circumference --      Peak Flow --      Pain Score 02/02/24 1558 4     Pain Loc --      Pain Education --      Exclude from Growth Chart --    No data found.  Updated Vital Signs BP (!) 150/77 (BP Location: Left Arm)   Pulse 90   Temp 98.5 F (36.9 C) (Oral)   Resp 16   Ht 5' 3 (1.6 m)   Wt 270 lb (122.5 kg)   SpO2 95%   BMI 47.83 kg/m      Physical Exam Vitals and nursing note reviewed.  Constitutional:      General: She is not in acute distress.    Appearance: Normal appearance. She is not ill-appearing or toxic-appearing.  HENT:     Head: Normocephalic and atraumatic.     Right Ear: Tympanic membrane, ear canal and external ear normal.     Left Ear: Ear canal and external ear normal. A middle ear effusion is present.     Nose: Congestion present.     Mouth/Throat:     Mouth: Mucous  membranes are moist.     Pharynx: Oropharynx is clear. Posterior oropharyngeal erythema present.  Eyes:     General: No scleral icterus.       Right eye: No discharge.        Left eye: No discharge.  Conjunctiva/sclera: Conjunctivae normal.  Cardiovascular:     Rate and Rhythm: Normal rate and regular rhythm.     Heart sounds: Normal heart sounds.  Pulmonary:     Effort: Pulmonary effort is normal. No respiratory distress.     Breath sounds: Normal breath sounds.  Musculoskeletal:     Cervical back: Neck supple.  Skin:    General: Skin is dry.  Neurological:     General: No focal deficit present.     Mental Status: She is alert. Mental status is at baseline.     Motor: No weakness.     Gait: Gait normal.  Psychiatric:        Mood and Affect: Mood normal.        Behavior: Behavior normal.      UC Treatments / Results  Labs (all labs ordered are listed, but only abnormal results are displayed) Labs Reviewed  GROUP A STREP BY PCR  SARS CORONAVIRUS 2 BY RT PCR    EKG   Radiology No results found.  Procedures Procedures (including critical care time)  Medications Ordered in UC Medications - No data to display  Initial Impression / Assessment and Plan / UC Course  I have reviewed the triage vital signs and the nursing notes.  Pertinent labs & imaging results that were available during my care of the patient were reviewed by me and considered in my medical decision making (see chart for details).   73 year old female presents for 2-day history of fatigue, left ear pain, sore throat, congestion and dry cough.  Patient is afebrile.  She is overall well-appearing.  She coughs a few times, it does sound deep.  On exam has clear effusion of the left TM and erythema posterior pharynx.  Chest is clear.  Heart regular rate and rhythm.  PCR strep and COVID testing obtained.  All negative.  Reviewed results of patient.  Viral URI.  Advised supportive care with  increasing rest and fluids.  Sent Atrovent  nasal spray, and Bromfed-DM. Reviewed typical course of most viral illnesses. Discussed return and ED precautions.    Final Clinical Impressions(s) / UC Diagnoses   Final diagnoses:  Acute upper respiratory infection  Sore throat  Left ear pain  Acute cough     Discharge Instructions      -Negative COVID and strep  URI/COLD SYMPTOMS: Your exam today is consistent with a viral illness. Antibiotics are not indicated at this time. Use medications as directed, including cough syrup, nasal saline, and decongestants. Your symptoms should improve over the next few days and resolve within a couple of weeks. Increase rest and fluids. F/u if symptoms worsen or predominate such as sore throat, ear pain, productive cough, shortness of breath, or if you develop high fevers or worsening fatigue over the next several days.       ED Prescriptions     Medication Sig Dispense Auth. Provider   ipratropium (ATROVENT ) 0.06 % nasal spray Place 2 sprays into both nostrils 4 (four) times daily. 15 mL Arvis Huxley B, PA-C   lidocaine  (XYLOCAINE ) 2 % solution Use as directed 15 mLs in the mouth or throat every 3 (three) hours as needed for mouth pain (swish and spit). 100 mL Arvis Huxley B, PA-C   promethazine -dextromethorphan (PROMETHAZINE -DM) 6.25-15 MG/5ML syrup Take 5 mLs by mouth 4 (four) times daily as needed. 118 mL Arvis Huxley B, PA-C   brompheniramine-pseudoephedrine-DM 30-2-10 MG/5ML syrup Take 5-10 ml po QID prn cough 150 mL Cristopher Ciccarelli B, PA-C  PDMP not reviewed this encounter.   Arvis Jolan NOVAK, PA-C 02/02/24 1732

## 2024-02-02 NOTE — ED Triage Notes (Signed)
 Pt states that she has left ear pain, sore throat and cough. X2 days
